# Patient Record
Sex: Male | Born: 1960 | Race: White | Hispanic: No | Marital: Married
Health system: Southern US, Academic
[De-identification: ages and names within clinical notes are randomized; demographics above are authoritative.]

## PROBLEM LIST (undated history)

## (undated) DIAGNOSIS — Z720 Tobacco use: Secondary | ICD-10-CM

## (undated) DIAGNOSIS — G8929 Other chronic pain: Secondary | ICD-10-CM

## (undated) DIAGNOSIS — M549 Dorsalgia, unspecified: Secondary | ICD-10-CM

## (undated) HISTORY — PX: HX BACK SURGERY: SHX140

---

## 2003-05-22 ENCOUNTER — Other Ambulatory Visit (HOSPITAL_COMMUNITY): Payer: Self-pay

## 2006-04-15 ENCOUNTER — Ambulatory Visit (INDEPENDENT_AMBULATORY_CARE_PROVIDER_SITE_OTHER): Payer: Self-pay | Admitting: Sports Medicine

## 2010-10-05 DEATH — deceased

## 2021-03-22 ENCOUNTER — Encounter (HOSPITAL_COMMUNITY): Payer: Self-pay

## 2021-03-22 ENCOUNTER — Inpatient Hospital Stay (HOSPITAL_COMMUNITY): Payer: 59

## 2021-03-22 ENCOUNTER — Inpatient Hospital Stay
Admission: AD | Admit: 2021-03-22 | Discharge: 2021-04-02 | DRG: 871 | Discharge status: Against Medical Advice | Payer: 59 | Source: Other Acute Inpatient Hospital | Attending: Internal Medicine | Admitting: Internal Medicine

## 2021-03-22 ENCOUNTER — Inpatient Hospital Stay (HOSPITAL_COMMUNITY): Payer: PRIVATE HEALTH INSURANCE | Admitting: Internal Medicine

## 2021-03-22 ENCOUNTER — Other Ambulatory Visit: Payer: Self-pay

## 2021-03-22 DIAGNOSIS — Z5329 Procedure and treatment not carried out because of patient's decision for other reasons: Secondary | ICD-10-CM | POA: Diagnosis not present

## 2021-03-22 DIAGNOSIS — Z79899 Other long term (current) drug therapy: Secondary | ICD-10-CM

## 2021-03-22 DIAGNOSIS — B1920 Unspecified viral hepatitis C without hepatic coma: Secondary | ICD-10-CM | POA: Diagnosis present

## 2021-03-22 DIAGNOSIS — R918 Other nonspecific abnormal finding of lung field: Secondary | ICD-10-CM

## 2021-03-22 DIAGNOSIS — Z20828 Contact with and (suspected) exposure to other viral communicable diseases: Secondary | ICD-10-CM | POA: Diagnosis present

## 2021-03-22 DIAGNOSIS — Z8701 Personal history of pneumonia (recurrent): Secondary | ICD-10-CM

## 2021-03-22 DIAGNOSIS — R590 Localized enlarged lymph nodes: Secondary | ICD-10-CM

## 2021-03-22 DIAGNOSIS — J90 Pleural effusion, not elsewhere classified: Secondary | ICD-10-CM | POA: Diagnosis present

## 2021-03-22 DIAGNOSIS — K219 Gastro-esophageal reflux disease without esophagitis: Secondary | ICD-10-CM | POA: Diagnosis present

## 2021-03-22 DIAGNOSIS — J189 Pneumonia, unspecified organism: Principal | ICD-10-CM

## 2021-03-22 DIAGNOSIS — F17200 Nicotine dependence, unspecified, uncomplicated: Secondary | ICD-10-CM | POA: Diagnosis present

## 2021-03-22 DIAGNOSIS — E871 Hypo-osmolality and hyponatremia: Secondary | ICD-10-CM | POA: Diagnosis not present

## 2021-03-22 DIAGNOSIS — J851 Abscess of lung with pneumonia: Secondary | ICD-10-CM | POA: Diagnosis present

## 2021-03-22 DIAGNOSIS — J440 Chronic obstructive pulmonary disease with acute lower respiratory infection: Secondary | ICD-10-CM | POA: Diagnosis present

## 2021-03-22 DIAGNOSIS — M549 Dorsalgia, unspecified: Secondary | ICD-10-CM | POA: Diagnosis present

## 2021-03-22 DIAGNOSIS — J9601 Acute respiratory failure with hypoxia: Secondary | ICD-10-CM | POA: Diagnosis present

## 2021-03-22 DIAGNOSIS — A419 Sepsis, unspecified organism: Principal | ICD-10-CM | POA: Diagnosis present

## 2021-03-22 DIAGNOSIS — F4310 Post-traumatic stress disorder, unspecified: Secondary | ICD-10-CM | POA: Diagnosis present

## 2021-03-22 DIAGNOSIS — M4800 Spinal stenosis, site unspecified: Secondary | ICD-10-CM | POA: Diagnosis present

## 2021-03-22 HISTORY — DX: Dorsalgia, unspecified: M54.9

## 2021-03-22 HISTORY — DX: Tobacco use: Z72.0

## 2021-03-22 HISTORY — DX: Other chronic pain: G89.29

## 2021-03-22 LAB — BASIC METABOLIC PANEL
ANION GAP: 8 mmol/L (ref 4–13)
BUN/CREA RATIO: 34 — ABNORMAL HIGH (ref 6–22)
BUN: 23 mg/dL (ref 8–25)
CALCIUM: 9.8 mg/dL (ref 8.8–10.2)
CHLORIDE: 101 mmol/L (ref 96–111)
CO2 TOTAL: 25 mmol/L (ref 23–31)
CREATININE: 0.67 mg/dL — ABNORMAL LOW (ref 0.75–1.35)
ESTIMATED GFR: >90 mL/min/BSA (ref >=60)
GLUCOSE: 123 mg/dL (ref 65–125)
POTASSIUM: 4.6 mmol/L (ref 3.5–5.1)
SODIUM: 134 mmol/L — ABNORMAL LOW (ref 136–145)

## 2021-03-22 LAB — CBC WITH DIFF
BASOPHIL #: 0.1 10*3/uL (ref <=0.20)
BASOPHIL %: 0 %
EOSINOPHIL #: <0.1 10*3/uL (ref <=0.50)
EOSINOPHIL %: 0 %
HCT: 34.5 % — ABNORMAL LOW (ref 38.9–52.0)
HGB: 11.2 g/dL — ABNORMAL LOW (ref 13.4–17.5)
IMMATURE GRANULOCYTE #: 0.92 10*3/uL — ABNORMAL HIGH (ref <0.10)
IMMATURE GRANULOCYTE %: 3 % — ABNORMAL HIGH (ref 0–1)
LYMPHOCYTE #: 0.67 10*3/uL — ABNORMAL LOW (ref 1.00–4.80)
LYMPHOCYTE %: 2 %
MCH: 26.5 pg (ref 26.0–32.0)
MCHC: 32.5 g/dL (ref 31.0–35.5)
MCV: 81.6 fL (ref 78.0–100.0)
MONOCYTE #: 1.37 10*3/uL — ABNORMAL HIGH (ref 0.20–1.10)
MONOCYTE %: 5 %
MPV: 9.5 fL (ref 8.7–12.5)
NEUTROPHIL #: 26.78 10*3/uL — ABNORMAL HIGH (ref 1.50–7.70)
NEUTROPHIL %: 90 %
PLATELETS: 334 10*3/uL (ref 150–400)
RBC: 4.23 10*6/uL — ABNORMAL LOW (ref 4.50–6.10)
RDW-CV: 15.2 % (ref 11.5–15.5)
WBC: 29.9 10*3/uL — ABNORMAL HIGH (ref 3.7–11.0)

## 2021-03-22 LAB — MAGNESIUM: MAGNESIUM: 1.9 mg/dL (ref 1.8–2.6)

## 2021-03-22 LAB — MRSA SCREEN, PCR, RAPID: MRSA COLONIZATION SCREEN: NEGATIVE

## 2021-03-22 LAB — RESPIRATORY CULTURE AND GRAM STAIN (PERFORMABLE)

## 2021-03-22 LAB — PHOSPHORUS: PHOSPHORUS: 4.1 mg/dL — ABNORMAL HIGH (ref 2.3–4.0)

## 2021-03-22 MED ORDER — ENOXAPARIN 40 MG/0.4 ML SUBCUTANEOUS SYRINGE
40.0000 mg | INJECTION | Freq: Every day | SUBCUTANEOUS | Status: DC
Start: 2021-03-23 — End: 2021-04-02
  Administered 2021-03-23 – 2021-03-26 (×4): 40 mg via SUBCUTANEOUS
  Administered 2021-03-27: 0 mg via SUBCUTANEOUS
  Administered 2021-03-28 – 2021-04-02 (×6): 40 mg via SUBCUTANEOUS
  Filled 2021-03-22 (×11): qty 0.4

## 2021-03-22 MED ORDER — METHOCARBAMOL 750 MG TABLET
750.0000 mg | ORAL_TABLET | Freq: Two times a day (BID) | ORAL | Status: DC
Start: 2021-03-23 — End: 2021-03-25
  Administered 2021-03-23 – 2021-03-25 (×5): 750 mg via ORAL
  Filled 2021-03-22 (×5): qty 1

## 2021-03-22 MED ORDER — DEXTROSE 5% IN WATER (D5W) FLUSH BAG - 250 ML
INTRAVENOUS | Status: DC | PRN
Start: 2021-03-22 — End: 2021-04-02

## 2021-03-22 MED ORDER — ELECTROLYTE-A INTRAVENOUS SOLUTION BOLUS
0.0000 mL | INTRAVENOUS | Status: DC | PRN
Start: 2021-03-22 — End: 2021-03-22

## 2021-03-22 MED ORDER — PIPERACILLIN-TAZOBACTAM 4.5 GRAM INTRAVENOUS SOLUTION
4.5000 g | Freq: Three times a day (TID) | INTRAVENOUS | Status: DC
Start: 2021-03-23 — End: 2021-04-02
  Administered 2021-03-22 – 2021-03-23 (×2): 4.5 g via INTRAVENOUS
  Administered 2021-03-23: 0 g via INTRAVENOUS
  Administered 2021-03-23: 4.5 g via INTRAVENOUS
  Administered 2021-03-23 (×2): 0 g via INTRAVENOUS
  Administered 2021-03-24 (×3): 4.5 g via INTRAVENOUS
  Administered 2021-03-24 (×2): 0 g via INTRAVENOUS
  Administered 2021-03-24: 4.5 g via INTRAVENOUS
  Administered 2021-03-24: 0 g via INTRAVENOUS
  Administered 2021-03-25: 4.5 g via INTRAVENOUS
  Administered 2021-03-25: 0 g via INTRAVENOUS
  Administered 2021-03-25: 4.5 g via INTRAVENOUS
  Administered 2021-03-25 – 2021-03-26 (×3): 0 g via INTRAVENOUS
  Administered 2021-03-26: 4.5 g via INTRAVENOUS
  Administered 2021-03-26: 0 g via INTRAVENOUS
  Administered 2021-03-26 (×2): 4.5 g via INTRAVENOUS
  Administered 2021-03-26 – 2021-03-27 (×2): 0 g via INTRAVENOUS
  Administered 2021-03-27: 4.5 g via INTRAVENOUS
  Administered 2021-03-27: 0 g via INTRAVENOUS
  Administered 2021-03-27: 4.5 g via INTRAVENOUS
  Administered 2021-03-27: 0 g via INTRAVENOUS
  Administered 2021-03-27: 4.5 g via INTRAVENOUS
  Administered 2021-03-28 (×2): 0 g via INTRAVENOUS
  Administered 2021-03-28: 4.5 g via INTRAVENOUS
  Administered 2021-03-28: 0 g via INTRAVENOUS
  Administered 2021-03-28 – 2021-03-29 (×3): 4.5 g via INTRAVENOUS
  Administered 2021-03-29 (×3): 0 g via INTRAVENOUS
  Administered 2021-03-29 (×2): 4.5 g via INTRAVENOUS
  Administered 2021-03-30: 0 g via INTRAVENOUS
  Administered 2021-03-30: 4.5 g via INTRAVENOUS
  Administered 2021-03-30: 0 g via INTRAVENOUS
  Administered 2021-03-30: 4.5 g via INTRAVENOUS
  Administered 2021-03-30: 0 g via INTRAVENOUS
  Administered 2021-03-30 – 2021-03-31 (×2): 4.5 g via INTRAVENOUS
  Administered 2021-03-31 (×3): 0 g via INTRAVENOUS
  Administered 2021-03-31 – 2021-04-01 (×5): 4.5 g via INTRAVENOUS
  Administered 2021-04-01 – 2021-04-02 (×5): 0 g via INTRAVENOUS
  Administered 2021-04-02 (×2): 4.5 g via INTRAVENOUS
  Filled 2021-03-22 (×33): qty 20

## 2021-03-22 MED ORDER — NICOTINE 21 MG/24 HR DAILY TRANSDERMAL PATCH
21.0000 mg | MEDICATED_PATCH | Freq: Every day | TRANSDERMAL | Status: DC
Start: 2021-03-22 — End: 2021-04-02
  Administered 2021-03-22 – 2021-04-02 (×12): 21 mg via TRANSDERMAL
  Filled 2021-03-22 (×12): qty 1

## 2021-03-22 MED ORDER — SODIUM CHLORIDE 0.9 % INTRAVENOUS PIGGYBACK
4.5000 g | INJECTION | INTRAVENOUS | Status: AC
Start: 2021-03-22 — End: 2021-03-22
  Administered 2021-03-22: 4.5 g via INTRAVENOUS
  Administered 2021-03-22: 0 g via INTRAVENOUS
  Filled 2021-03-22: qty 20

## 2021-03-22 MED ORDER — VANCOMYCIN 10 GRAM INTRAVENOUS SOLUTION
15.0000 mg/kg | Freq: Two times a day (BID) | INTRAVENOUS | Status: DC
Start: 2021-03-22 — End: 2021-03-23
  Administered 2021-03-22: 0 mg via INTRAVENOUS
  Administered 2021-03-22 – 2021-03-23 (×2): 1250 mg via INTRAVENOUS
  Administered 2021-03-23 (×2): 0 mg via INTRAVENOUS
  Filled 2021-03-22 (×2): qty 12.5

## 2021-03-22 MED ORDER — GABAPENTIN 300 MG CAPSULE
300.0000 mg | ORAL_CAPSULE | Freq: Three times a day (TID) | ORAL | Status: DC
Start: 2021-03-22 — End: 2021-03-23
  Administered 2021-03-22 – 2021-03-23 (×2): 300 mg via ORAL
  Filled 2021-03-22 (×2): qty 1

## 2021-03-22 MED ORDER — SODIUM CHLORIDE 0.9 % (FLUSH) INJECTION SYRINGE
2.0000 mL | INJECTION | Freq: Three times a day (TID) | INTRAMUSCULAR | Status: DC
Start: 2021-03-22 — End: 2021-04-02
  Administered 2021-03-22 (×2): 6 mL
  Administered 2021-03-23 (×2): 0 mL
  Administered 2021-03-23 – 2021-03-24 (×2): 4 mL
  Administered 2021-03-24: 6 mL
  Administered 2021-03-24: 0 mL
  Administered 2021-03-25: 5 mL
  Administered 2021-03-25 (×2): 0 mL
  Administered 2021-03-26: 2 mL
  Administered 2021-03-26 – 2021-03-27 (×4): 0 mL
  Administered 2021-03-27: 2 mL
  Administered 2021-03-28: 6 mL
  Administered 2021-03-28 – 2021-03-29 (×5): 0 mL
  Administered 2021-03-30: 6 mL
  Administered 2021-03-30 – 2021-04-02 (×9): 0 mL

## 2021-03-22 MED ORDER — SODIUM CHLORIDE 0.9 % (FLUSH) INJECTION SYRINGE
2.0000 mL | INJECTION | INTRAMUSCULAR | Status: DC | PRN
Start: 2021-03-22 — End: 2021-04-02

## 2021-03-22 MED ORDER — SODIUM CHLORIDE 0.9% FLUSH BAG - 250 ML
INTRAVENOUS | Status: DC | PRN
Start: 2021-03-22 — End: 2021-04-02

## 2021-03-22 MED ORDER — VANCOMYCIN IV - PHARMACIST TO DOSE PER PROTOCOL - NO EXCLUSION CRITERIA
Freq: Every day | Status: DC | PRN
Start: 2021-03-22 — End: 2021-03-23

## 2021-03-22 NOTE — H&P (Signed)
Medical Intensive Care Unit  HISTORY & PHYSICAL     Name: Alexander, Richard, 61 y.o. male Date of Service:  03/22/2021   MRN  Date of Birth:  May 25, 1960  [id number] Attending: Eula Flax, MD   PCP: Morton Amy, MD Chief Complaint: had no chief complaint listed for this encounter.   Code Status: No Order Admitted for: Pneumonia     HISTORY OF PRESENTING ILLNESS:     Alexander Richard is a 61 y.o. male who presented to MICU 1 service on 03/22/21 from [place] with concerns of hemoptysis, fevers, and chills for the last 4 days. Pt notes covid exposure 1 week ago. Pt has had a right lung mass-like consolidation since 10/2020. He has undergone BAL that was negative for AFB/legionella at the [place].   On CT w/o contrast at the [place] the pt was found to have worsening right middle lobe. There is a new cavitary mass-like consolidation in the central  Superior right lower lobe. Concerning for cavitary pneumonia, intrapulmonary abscess, or neoplasm.      MEDICAL HISTORY:     PAST MEDICAL & SURGICAL HISTORIES:   No past medical history on file. No past surgical history on file.   HOME MEDICATIONS:  No outpatient medications have been marked as taking for the 03/22/21 encounter [facility] Encounter).      ALLERGIES:  He has No Known Allergies.      FAMILY HISTORY:  His family history is not on file.   SOCIAL HISTORY:  He  has no history on file for alcohol use. He  has no history on file for tobacco use. He  has no history on file for drug use.     ROS:            Constitutional: + for fevers, chills, diaphoresis  Respiratory: + for hemoptysis, shortness of breath  Cardiovascular: Negative for chest pain, palpitations, lower extremity edema  Gastrointestinal: +for nausea, and decreased appetite       OBJECTIVE:    VITAL SIGNS:   BP (Non-Invasive): 111/72 Heart Rate: 84   Respiratory Rate: (!) 38 SpO2: 90 % Weight: 96.5 kg (212 lb 11.9 oz)        INTAKE & OUTPUT:  Net Since Admission: --    Intake/Output Summary (Last 24  hours) at 03/22/2021 1911  Last data filed at 03/22/2021 1820  Gross per 24 hour   Intake 100 ml   Output --   Net 100 ml     Date of Last Bowel Movement:  (PTA)         VENTILATOR SETTINGS:  Ventilator Settings:   room air          LINES & DRAINS:   Patient Lines/Drains/Airways Status     Active Line / Dialysis Catheter / Dialysis Graft / Drain / Airway / Wound     Name Placement date Placement time Site Days    Peripheral IV Ultrasound guided Left Forearm 03/22/21  1700  -- less than 1    Midline Single Lumen Left 03/22/21  1639  --  less than 1                 Physical Exam  General: appears older than stated age, NAEO  Eyes: Conjunctiva clear, Pupils equal and round, Sclera non-icteric  HENT: Head atraumatic and normocephalic, Mouth mucous membranes pink  Neck: Trachea Midline   Lungs: Clear to auscultation bilaterally, mildly diminished  Cardiovascular: Regular rate and rhythm, no murmur, click,  rub or gallop  Abdomen: Soft, non-tender, Bowel sounds normal, non-distended  Extremities: No cyanosis or edema  Skin: Skin warm and dry  Neurologic: Alert and oriented x3    LABS  IMAGING  MICROBIOLOGY  PATHOLOGY:     I have reviewed all of the recent labs, imaging, microbiology and pathology.    CBC Differential   Recent Labs     03/22/21  1655   WBC 29.9*   HGB 11.2*   HCT 34.5*   PLTCNT 334    Recent Labs     03/22/21  1655   PMNS 90   MONOCYTES 5   BASOPHILS 0  0.10   PMNABS 26.78*   LYMPHSABS 0.67*   MONOSABS 1.37*   EOSABS <0.10      BMP LFTs   Recent Labs     03/22/21  1655   SODIUM 134*   POTASSIUM 4.6   CHLORIDE 101   CO2 25   BUN 23   CREATININE 0.67*   GLUCOSENF 123   ANIONGAP 8   BUNCRRATIO 34*   GFR >90   CALCIUM 9.8   MAGNESIUM 1.9   PHOSPHORUS 4.1*    No results found for this encounter   CoAgs Blood Gas:   No results found for this encounter No results found for this encounter    Cardiac Markers Lipid Panel   No results for input(s): TROPONINI, CKMB, MBINDEX, BNP in the last 72 hours. No results found  for this encounter   Urine Analysis Other Labs   No results found for this encounter No results found for this encounter    Invalid input(s): PRL     Blood Cx 03/22/21 - pending    CT Chest 03/22/21 (Jay): worsening right middle lobe consolidation. New cavitary, mass-like consolidation in the central superior right lower lobe. Small loculated right sided pleural effusion. Enlarging right hilar and mediastinal lymphadenopathy    ASSESSMENT:     Alexander Richard is a 61 y.o. male with a past medical hx of spinal stenosis, chronic back pain, tobacco use, and GERd , who presented to MICU as a transfer from [facility] with new mass-like consolidation in right lower lung and recurrent pneumonias. Pt was hypotensive at the outside hospital but this has resolved.      Active Hospital Problems    Diagnosis   . Primary Problem: Pneumonia       PLAN:     Neurologic:  No Acute concerns  . Alert and Oriented x3     Cardiovascular:  Hypotension, resolved  . Continue to monitor    Respiratory:  Concern for lung mass vs cavitary pna vs intrapulmonary abscess  . On Room Air  Imaging significant for worsening right middle lobe consolidation. New cavitary, mass-like consolidation in the central superior right lower lobe. Small loculated right sided pleural effusion. Enlarging right hilar and mediastinal lymphadenopathy  . MRSA swab pending, Respiratory Biofire, Streptococcal and Legionella Urine Antigens pending  . Vanc zosyn, first doses given at [place]. Next dose 3/20 in the morning  . Sputum cultures  . Incentive Spirometry        GI/FEN:  No acute concerns   Possible procedure in am, NPO at midnight    Renal:  No acute concerns  . Creatinine 0.67  . Strict I/O    Infectious Diseases:  Recurrent Pneumonia  . WBC 29.9  . Blood cultures pending  . Sputum cultures pending  . Continue Vanc/zosyn for broad spectrum coverage  Endocrinologic:  No acute concerns       Hematology  No acute concerns      ___     Diet: Regular diet,  NPO at midnight   Fluids: None   Analgesia: Methocarbamol, gabapentin   Sedation: None   DVT/PE Prophylaxis: lovenox   Ulcer Prophylaxis: none   Glycemic Control: none   Bowel Regimen: none    Consults: none   Hardware (Lines, Drains, Foley, Tubes): PIV    Activity: Up w/ assistance and Up in chair TID w/ all meals   Therapy: PT/OT       Disposition Planning - TBD    // Rosemary Holms, DO   Resident, PGY-1  Department of Anesthesiology  03/22/2021, 19:11        ----------------------------------------------------------------------------------------------------------------------  Attending Note:   DOS: 03/22/2021  I have seen and evaluated the patient with the medical staff, all labs, and imaging studies reviewed.   I agree with the findings including assessment and plan as documented in the resident's note.  Any exceptions/additions are noted .    Case was discussed in the multidiscipliary rounds.     Total Critical care Time Spent (not including procedures): 30 min     Hatim Al-Jaroushi MD  Associate Professor, [place]

## 2021-03-22 NOTE — Pharmacy (Signed)
[facility] / Department of Pharmaceutical Services  Therapeutic Drug Monitoring: Vancomycin  03/22/2021      Patient name: Alexander Richard  Date of Birth:  16-Nov-1960    Actual Weight:  Weight: 96.5 kg (212 lb 11.9 oz) (03/22/21 1700)     BMI:  BMI (Calculated): 29.73 (03/22/21 1700)      Date RPh Current regimen (including mg/kg) Indication &  Organism AUC or trough based dosing Target Levels^ SCr (mg/dL) CrCl* (mL/min) Infectious Laboratory Markers (as applicable)   Measured level(s)   (mcg/mL) Calculated AUC (if AUC based monitoring) Plan & predicted AUC/trough if initial dosing (including when levels are due) Comments   3/19 eef Vanc 1000 mg x 1 @ 0500 at OSH empiric AUC 400-600 0.67 >100 WBC:  Procal:  CRP:   - Vanc 1250 mg q12h for anticipated AUC of 437  - f/u cultures and de-escalate as able  - obtain levels as clinically indicated                                                                                                    ^Target levels depends on dosing and monitoring method, AUC vs. trough based. For AUC based dosing units are mg*h/L. For trough based dosing units are mcg/mL.     *Creatinine clearance is estimated by using the Cockcroft-Gault equation for adult patients and the Brendolyn Patty for pediatric patients.    The decision to discontinue vancomycin therapy will be determined by the primary service.  Please contact the pharmacist with any questions regarding this patient's medication regimen.

## 2021-03-22 NOTE — Nurses Notes (Signed)
Pt transferred from outside facility to [id number]. MICU to bedside. VS and assessment per flowsheets.

## 2021-03-23 ENCOUNTER — Inpatient Hospital Stay (HOSPITAL_COMMUNITY): Payer: 59

## 2021-03-23 DIAGNOSIS — F4310 Post-traumatic stress disorder, unspecified: Secondary | ICD-10-CM

## 2021-03-23 DIAGNOSIS — J449 Chronic obstructive pulmonary disease, unspecified: Secondary | ICD-10-CM

## 2021-03-23 DIAGNOSIS — J439 Emphysema, unspecified: Secondary | ICD-10-CM

## 2021-03-23 DIAGNOSIS — R918 Other nonspecific abnormal finding of lung field: Secondary | ICD-10-CM

## 2021-03-23 DIAGNOSIS — I2510 Atherosclerotic heart disease of native coronary artery without angina pectoris: Secondary | ICD-10-CM

## 2021-03-23 DIAGNOSIS — R590 Localized enlarged lymph nodes: Secondary | ICD-10-CM

## 2021-03-23 DIAGNOSIS — M542 Cervicalgia: Secondary | ICD-10-CM

## 2021-03-23 DIAGNOSIS — J984 Other disorders of lung: Secondary | ICD-10-CM

## 2021-03-23 DIAGNOSIS — Z79899 Other long term (current) drug therapy: Secondary | ICD-10-CM

## 2021-03-23 LAB — MAGNESIUM: MAGNESIUM: 1.9 mg/dL (ref 1.8–2.6)

## 2021-03-23 LAB — BASIC METABOLIC PANEL, FASTING
ANION GAP: 7 mmol/L (ref 4–13)
BUN/CREA RATIO: 40 — ABNORMAL HIGH (ref 6–22)
BUN: 28 mg/dL — ABNORMAL HIGH (ref 8–25)
CALCIUM: 9.1 mg/dL (ref 8.8–10.2)
CHLORIDE: 101 mmol/L (ref 96–111)
CO2 TOTAL: 26 mmol/L (ref 23–31)
CREATININE: 0.7 mg/dL — ABNORMAL LOW (ref 0.75–1.35)
ESTIMATED GFR: >90 mL/min/BSA (ref >=60)
GLUCOSE: 96 mg/dL (ref 70–99)
POTASSIUM: 4.4 mmol/L (ref 3.5–5.1)
SODIUM: 134 mmol/L — ABNORMAL LOW (ref 136–145)

## 2021-03-23 LAB — LEGIONELLA URINE ANTIGEN: LEGIONELLA ANTIGEN: NEGATIVE

## 2021-03-23 LAB — RESPIRATORY VIRUS PANEL

## 2021-03-23 LAB — CBC WITH DIFF
BASOPHIL #: <0.1 10*3/uL (ref <=0.20)
BASOPHIL %: 0 %
EOSINOPHIL #: <0.1 10*3/uL (ref <=0.50)
EOSINOPHIL %: 0 %
HCT: 37.5 % — ABNORMAL LOW (ref 38.9–52.0)
HGB: 12.2 g/dL — ABNORMAL LOW (ref 13.4–17.5)
IMMATURE GRANULOCYTE #: 0.74 10*3/uL — ABNORMAL HIGH (ref <0.10)
IMMATURE GRANULOCYTE %: 3 % — ABNORMAL HIGH (ref 0–1)
LYMPHOCYTE #: 0.67 10*3/uL — ABNORMAL LOW (ref 1.00–4.80)
LYMPHOCYTE %: 3 %
MCH: 26.4 pg (ref 26.0–32.0)
MCHC: 32.5 g/dL (ref 31.0–35.5)
MCV: 81.2 fL (ref 78.0–100.0)
MONOCYTE #: 1.42 10*3/uL — ABNORMAL HIGH (ref 0.20–1.10)
MONOCYTE %: 6 %
MPV: 9.3 fL (ref 8.7–12.5)
NEUTROPHIL #: 22.57 10*3/uL — ABNORMAL HIGH (ref 1.50–7.70)
NEUTROPHIL %: 88 %
PLATELETS: 275 10*3/uL (ref 150–400)
RBC: 4.62 10*6/uL (ref 4.50–6.10)
RDW-CV: 15.4 % (ref 11.5–15.5)
WBC: 25.5 10*3/uL — ABNORMAL HIGH (ref 3.7–11.0)

## 2021-03-23 LAB — PHOSPHORUS: PHOSPHORUS: 4.3 mg/dL — ABNORMAL HIGH (ref 2.3–4.0)

## 2021-03-23 LAB — STREPTOCOCCUS PNEUMONIAE ANTIGEN,URINE: S.PNEUMONIA ANTIGEN: NEGATIVE

## 2021-03-23 MED ORDER — IOPAMIDOL 370 MG IODINE/ML (76 %) INTRAVENOUS SOLUTION
80.0000 mL | INTRAVENOUS | Status: AC
Start: 2021-03-23 — End: 2021-03-23
  Administered 2021-03-23: 80 mL via INTRAVENOUS

## 2021-03-23 MED ORDER — ALBUTEROL SULFATE CONCENTRATE 2.5 MG/0.5 ML SOLUTION FOR NEBULIZATION
2.5000 mg | INHALATION_SOLUTION | RESPIRATORY_TRACT | Status: DC | PRN
Start: 2021-03-23 — End: 2021-04-02
  Filled 2021-03-23: qty 3

## 2021-03-23 MED ORDER — GABAPENTIN 300 MG CAPSULE
600.0000 mg | ORAL_CAPSULE | Freq: Three times a day (TID) | ORAL | Status: DC
Start: 2021-03-23 — End: 2021-04-02
  Administered 2021-03-23 – 2021-03-27 (×13): 600 mg via ORAL
  Administered 2021-03-27: 0 mg via ORAL
  Administered 2021-03-28 – 2021-04-02 (×17): 600 mg via ORAL
  Filled 2021-03-23 (×30): qty 2

## 2021-03-23 MED ORDER — IPRATROPIUM 0.5 MG-ALBUTEROL 3 MG (2.5 MG BASE)/3 ML NEBULIZATION SOLN
3.0000 mL | INHALATION_SOLUTION | Freq: Four times a day (QID) | RESPIRATORY_TRACT | Status: DC
Start: 2021-03-23 — End: 2021-03-23
  Filled 2021-03-23: qty 3

## 2021-03-23 MED ORDER — DULOXETINE 60 MG CAPSULE,DELAYED RELEASE
60.0000 mg | DELAYED_RELEASE_CAPSULE | Freq: Every day | ORAL | Status: DC
Start: 2021-03-23 — End: 2021-04-02
  Administered 2021-03-23 – 2021-04-02 (×11): 60 mg via ORAL
  Filled 2021-03-23 (×11): qty 1

## 2021-03-23 MED ORDER — SENNOSIDES 8.6 MG-DOCUSATE SODIUM 50 MG TABLET
1.0000 | ORAL_TABLET | Freq: Two times a day (BID) | ORAL | Status: DC
Start: 2021-03-23 — End: 2021-04-02
  Administered 2021-03-23 – 2021-03-24 (×4): 0 via ORAL
  Administered 2021-03-25 – 2021-03-26 (×3): 1 via ORAL
  Administered 2021-03-26: 0 via ORAL
  Administered 2021-03-27: 1 via ORAL
  Administered 2021-03-27: 0 via ORAL
  Administered 2021-03-28: 1 via ORAL
  Administered 2021-03-28 – 2021-04-02 (×7): 0 via ORAL
  Filled 2021-03-23 (×10): qty 1

## 2021-03-23 NOTE — Pharmacy (Addendum)
Pharmacy Medication Reconciliation    Patient Name: Alexander Richard, Alexander Richard  Date of Service: 03/23/2021  Date of Admission: 03/22/2021  Date of Birth: 1960-12-15  Length of Stay:   1 day   Service: MICU1      Transitions of Care:  Discharge Pharmacy services were discussed with the patient and the Meds to [facility] flowsheet and preferred pharmacy information were updated in EMR if applicable and able to assess with patient.    Information was collected from:  Patient and Pharmacy    [facility] [place], [place] - [address]  [address] [place] [address]  Phone: [phone] Fax: [phone]    [facility]  [address]  [place] [address]  Phone: [phone] Fax: [phone]    Clarified Prior to Admission Medications:  Prior to Admission medications    Medication Sig Taking Resumed Y/N (RPh) Comments   albuterol sulfate (PROVENTIL OR VENTOLIN OR PROAIR) 90 mcg/actuation Inhalation oral inhaler Take 2 Puffs by inhalation Four times a day as needed Yes  N Last fill 09/19 for 90 days              cetirizine (ZYRTEC) 10 mg Oral Tablet Take 1 Tablet (10 mg total) by mouth Once a day Yes  N Last fill 09/02 for 90 days        Cholecalciferol, Vitamin D3, 50 mcg (2,000 unit) Oral Capsule Take 1 Capsule by mouth Once a day Yes  N Last fill November 2022 for 90 days        diclofenac sodium (VOLTAREN) 1 % Gel Apply 4 g topically Three times a day as needed Yes  N Last fill 02/08 for 90 days        DULoxetine (CYMBALTA DR) 60 mg Oral Capsule, Delayed Release(E.C.) Take 1 Capsule (60 mg total) by mouth Once a day Yes  N Last fill 02/20 for 90 days    PT has been without for about 38 hours now and states he experiences adverse symptoms if not taken every 24 hours      gabapentin (NEURONTIN) 600 mg Oral Tablet Take 1 Tablet (600 mg total) by mouth Three times a day Yes  Y - started at 300 mg TID Last fill 03/13, unable to verify on PDMP - reported to me by [facility] pharmacist      meloxicam (MOBIC) 15 mg Oral Tablet Take 1 Tablet (15 mg total) by mouth Once a day Yes  N Last fill September 2022 for 90 days      methocarbamoL (ROBAXIN) 750 mg Oral Tablet Take 1 Tablet (750 mg total) by mouth Twice daily Yes  Y Last fill February 2023 for 90 days        tamsulosin (FLOMAX) 0.4 mg Oral Capsule Take 1 Capsule (0.4 mg total) by mouth Every evening after dinner Yes  N Last fill December 2022 for 90 days        tiotropium-olodateroL (STIOLTO RESPIMAT) 2.5-2.5 mcg/actuation Inhalation Mist Take 2 INHALATION by inhalation Once a day Yes  N Last fill January 2023 for 90 days              Did patient's home medication list require updates? Yes    Medications UPDATED on Prior to Admission Med List:    Medications ADDED to Prior to Admission Med List:  All medications added new; reported by [facility] and confirmed by patient.    Allergies:    No Known  Allergies    Meribeth Mattes, Pharmacy Technician    Did pharmacist make suggestions for medication reconciliation? Yes    Medications REMOVED from home medication list:  No medications removed at this time.  Pharmacist Recommendations:  Patient requested resuming home Cymbalta and gabapentin. Gabapentin 300 mg TID started inpatient - discussed with primary and will increase to home regimen. Will discuss resumption of Cymbalta. No other medications clinically indicated at this time - will continue to assess.    Patient reports adherence to gabapentin, methocarbamol, inhalers, and duloxetine - noted nonadherent to remainder of medications listed. Removed Vitamin D3, Meloxicam, and Cetirizine and diclofenac medications. Updated gabapentin order and added duloxetine.     Lorenza Cambridge, PHARMACY RESIDENT  03/23/2021, 10:29

## 2021-03-23 NOTE — Care Plan (Signed)
[facility]  Rehabilitation Services  Physical Therapy Initial Evaluation    Patient Name: Alexander Richard  Date of Birth: 06/16/1960  Height: Height: 180.3 cm (5\' 11" )  Weight: Weight: 96.5 kg (212 lb 11.9 oz)  Room/Bed: 15/A  Payor: [employer] / Plan: [facility] [employer]AN / Product Type: Non Managed Care /     Assessment:   Alexander Richard is a 61 y.o. male with a past medical hx of spinal stenosis, chronic back pain, tobacco use, and GERd , who presented to MICU as a transfer from [place] with new mass-like consolidation in right lower lung and recurrent pneumonias.     pt seen in MICU. pt alert and cooperative. pt moving all limbs well. pt amb x 50 ft x CGA x fair- balance. Pt recommends home with assist when medically stable. At baseline pt indept comm amb and drives, no DME, lives with wife.    Discharge Needs:    Equipment Recommendation: (P) none anticipated       Discharge Disposition: (P) home with assist    JUSTIFICATION OF DISCHARGE RECOMMENDATION   Based on current diagnosis, functional performance prior to admission, and current functional performance, this patient requires continued PT services in (P) home with assist in order to achieve significant functional improvements in these deficit areas: (P) aerobic capacity/endurance, gait, locomotion, and balance.        Plan:   Current Intervention: (P) balance training, gait training, stair training, strengthening, transfer training  To provide physical therapy services (P) minimum of 2x/week  for duration of (P) until discharge.    The risks/benefits of therapy have been discussed with the patient/caregiver and he/she is in agreement with the established plan of care.       Subjective & Objective        03/23/21 0926   Therapist Pager   PT Assigned/ Pager # [phone]   Rehab Session   Document Type evaluation   Total PT Minutes: 23   Patient Effort good   Symptoms Noted During/After Treatment fatigue   Symptoms  Noted Comment dry cough   General Information   Patient Profile Reviewed yes   Onset of Illness/Injury or Date of Surgery 03/22/21   Pertinent History of Current Functional Problem Alexander Richard is a 61 y.o. male with a past medical hx of spinal stenosis, chronic back pain, tobacco use, and GERd , who presented to MICU as a transfer from [place] with new mass-like consolidation in right lower lung and recurrent pneumonias.   Medical Lines PIV Line;Telemetry   Respiratory Status nasal cannula   Existing Precautions/Restrictions fall precautions;full code   Mutuality/Individual Preferences   Anxieties, Fears or Concerns agreeable to Pt   Individualized Care Needs oob x sba   Plan of Care Reviewed With patient   Living Environment   Lives With spouse   Living Arrangements house   Home Assessment: No Problems Identified   Home Accessibility bed and bath on same level;stairs (1 railing present)   Living Environment Comment 8 STE with rail, lives one level , wife helps out   Functional Level Prior   Ambulation 0 - independent   Transferring 0 - independent   Toileting 0 - independent   Bathing 0 - independent   Dressing 0 - independent   Eating 0 - independent   Communication 0 - understands/communicates without difficulty   Prior Functional Level Comment indept comm am b, drives, wife helps out  Self-Care   Usual Activity Tolerance good   Current Activity Tolerance fair   Equipment Currently Used at Home no   Pre Treatment Status   Pre Treatment Patient Status Patient sitting in bedside chair or w/c;Call light within reach;Telephone within reach;Nurse approved session   Support Present Pre Treatment  None   Communication Pre Treatment  Nurse   Cognitive Assessment/Interventions   Behavior/Mood Observations behavior appropriate to situation, WNL/WFL   Orientation Status oriented x 4   Attention WNL/WFL   Follows Commands WNL   Vital Signs   Pre-Treatment Heart Rate (beats/min) 71   Post-treatment Heart Rate  (beats/min) 72   Pre-Treatment Resp Rate (breaths/min) 17   Pre Treatment BP 108/79   Pre SpO2 (%) 95   O2 Delivery Pre Treatment supplemental O2   Post SpO2 (%) 96   O2 Delivery Post Treatment supplemental O2   Vitals Comment 4L NC no s/s distress   Pain Assessment   Pre/Posttreatment Pain Comment no c/o pain   General Extremity Assessment   Comment arom x 4 x atleast 3/5 erect trunk in sit and stand   Transfer Assessment/Treatment   Sit-Stand Independence contact guard assist   Stand-Sit Independence contact guard assist   Sit-Stand-Sit, Assist Device handheld assist   Bed-Chair Independence contact guard assist   Bed-Chair-Bed Assist Device handheld assist   Gait Assessment/Treatment   Total Distance Ambulated 50   Independence  contact guard assist   Assistive Device  hand-held assistance   Distance in Feet 50   Gait Speed slow to medium   Safety Issues  balance decreased during turns   Impairments  endurance;strength decreased   Balance Skill Training   Comment xHH   Sitting Balance: Static good balance   Sitting, Dynamic (Balance) fair + balance   Sit-to-Stand Balance fair balance   Standing Balance: Static fair balance   Standing Balance: Dynamic fair - balance   Systems Impairment Contributing to Balance Disturbance musculoskeletal   Identified Impairments Contributing to Balance Disturbance decreased strength   Therapeutic Exercise/Activity   Comment arom x 4 x 20 reps, STS and amb traiining , upto chair   Functional Endurance Training   Comment, Functional Endurance fair-   Post Treatment Status   Post Treatment Patient Status Patient sitting in bedside chair or w/c;Call light within reach;Telephone within reach   Support Present Post Treatment  None   Communication Post Treatement Nurse   Communication Post Treatment Comment pt performance   Plan of Care Review   Plan Of Care Reviewed With patient   Basic Mobility Am-PAC/6Clicks Score (APPROVED PT Staff, [facility], [facility] ONLY, [facility], [facility], and [facility])    Turning in bed without bedrails 4   Lying on back to sitting on edge of flat bed 4   Moving to and from a bed to a chair 3   Standing up from chair 3   Walk in room 3   Climbing 3-5 steps with railing 3   6 Clicks Raw Score total 20   Standardized (t-scale) score 43.99   CMS 0-100% Score 33.32   CMS Modifier CJ   Patient Mobility Goal (JHHLM) 8- Walk 250 feet or more 3X/day   Exercise/Activity Level Performed 7- Walked 25 feet or more   Physical Therapy Clinical Impression   Assessment KACEY VICUNA is a 61 y.o. male with a past medical hx of spinal stenosis, chronic back pain, tobacco use, and GERd , who presented to MICU as a transfer from [place]  with new mass-like consolidation in right lower lung and recurrent pneumonias. pt seenin MICU. pt alert and cooperative. pt moving alllimbs well. pt amb x 50 ft x CGA x fair- balance. Pt reocmmends home with assist when medically stable. At baseline pt indept comm amb and drives, no dme.   Criteria for Skilled Therapeutic yes;meets criteria;skilled treatment is necessary   Pathology/Pathophysiology Noted musculoskeletal;pulmonary;endocrine/metabolic   Impairments Found (describe specific impairments) aerobic capacity/endurance;gait, locomotion, and balance   Functional Limitations in Following  self-care;home management   Disability: Inability to Perform community/leisure   Rehab Potential good   Therapy Frequency minimum of 2x/week   Predicted Duration of Therapy Intervention (days/wks) until discharge   Anticipated Equipment Needs at Discharge (PT) none anticipated   Anticipated Discharge Disposition home with assist   Evaluation Complexity Justification   Patient History: Co-morbidity/factors that impact Plan of Care 1-2 that impact Plan of Care   Examination Components 4 or more Exam elements addressed   Presentation Evolving: Symptoms, complaints, characteristics of condition changing &/or cognitive deficits present   Clinical Decision Making Moderate  complexity   Evaluation Complexity Moderate complexity   Care Plan Goals   PT Rehab Goals Gait Training Goal;Stairs Training Goal;Transfer Training Goal   Gait Training  Goal, Distance to Achieve   Gait Training  Goal, Date Established 03/23/21   Gait Training  Goal, Time to Achieve by discharge   Gait Training  Goal, Independence Level independent   Stairs Training Goal   Stairs Training Goal, Date Established 03/23/21   Stairs Training Goal, Time to Achieve by discharge   Stairs Training Goal, Independence Level independent   Stairs Training Goal, Number of Stairs to Achieve 8 steps x Armed forces technical officerrail   Transfer Training Goal   Transfer Training Goal, Date Established 03/23/21   Transfer Training Goal, Time to Achieve by discharge   Transfer Training Goal, Activity Type all transfers   Transfer Training Goal, Independence Level independent   Planned Therapy Interventions, PT Eval   Planned Therapy Interventions (PT) balance training;gait training;stair training;strengthening;transfer training   Psychosocial Support   Trust Relationship/Rapport care explained;questions answered;reassurance provided       Therapist:   Flavia ShipperSharda Zinedine Ellner, PT   Pager #: [phone]

## 2021-03-23 NOTE — Progress Notes (Signed)
Medical Intensive Care Unit  Progress Note     Name: Alexander, Richard, 61 y.o. male Date of Service:  03/23/2021   MRN  Date of Birth:  1960-02-25  [id number] Attending: Dione Booze*   PCP: Morton Amy, MD Chief Complaint: had no chief complaint listed for this encounter.   Code Status: Full Code Admitted for: Pneumonia     SUBJECTIVE:     Alexander Richard is a 61 y.o. male who presented to MICU 1 service on 03/22/21 from [place] with concerns of hemoptysis, fevers, and chills for the last 4 days. Pt notes covid exposure 1 week ago. Pt has had a right lung mass-like consolidation since 10/2020. He has undergone BAL that was negative for AFB/legionella at the [place].    On CT w/o contrast at the [place] the pt was found to have worsening right middle lobe. There is a new cavitary mass-like consolidation in the central  Superior right lower lobe. Concerning for cavitary pneumonia, intrapulmonary abscess, or neoplasm.     Overnight events:  No acute events overnight.  Patient remained hemodynamically stable since arrival to the ICU. Patient has not required pressors not fluids to maintain hemodynamic stability.     MEDICAL HISTORY:     PAST MEDICAL & SURGICAL HISTORIES:   No past medical history on file. No past surgical history on file.   HOME MEDICATIONS:  Outpatient Medications Marked as Taking for the 03/22/21 encounter [facility] Encounter)   Medication Sig   . albuterol sulfate (PROVENTIL OR VENTOLIN OR PROAIR) 90 mcg/actuation Inhalation oral inhaler Take 2 Puffs by inhalation Four times a day as needed   . DULoxetine (CYMBALTA DR) 60 mg Oral Capsule, Delayed Release(E.C.) Take 1 Capsule (60 mg total) by mouth Once a day   . gabapentin (NEURONTIN) 600 mg Oral Tablet Take 1 Tablet (600 mg total) by mouth Three times a day   . methocarbamoL (ROBAXIN) 750 mg Oral Tablet Take 1 Tablet (750 mg total) by mouth Twice daily   . tamsulosin (FLOMAX) 0.4 mg Oral Capsule Take 1 Capsule (0.4 mg total) by mouth  Every evening after dinner   . tiotropium-olodateroL (STIOLTO RESPIMAT) 2.5-2.5 mcg/actuation Inhalation Mist Take 2 INHALATION by inhalation Once a day      ALLERGIES:  He has No Known Allergies.      FAMILY HISTORY:  His family history is not on file.   SOCIAL HISTORY:  He  has no history on file for alcohol use. He  has no history on file for tobacco use. He  has no history on file for drug use.     ROS:      Negative except as noted in the HPI/Subjective.       OBJECTIVE:    VITAL SIGNS:   BP (Non-Invasive): 107/76 Heart Rate: 78   Respiratory Rate: 17 SpO2: 95 % Weight: 96.5 kg (212 lb 11.9 oz)        INTAKE & OUTPUT:  Net Since Admission: --    Intake/Output Summary (Last 24 hours) at 03/23/2021 1136  Last data filed at 03/23/2021 1021  Gross per 24 hour   Intake 957.62 ml   Output --   Net 957.62 ml     Date of Last Bowel Movement:  (PTA)         VENTILATOR SETTINGS:  Ventilator Settings:   room air          LINES & DRAINS:   Patient Lines/Drains/Airways Status  Active Line / Dialysis Catheter / Dialysis Graft / Drain / Airway / Wound     Name Placement date Placement time Site Days    Peripheral IV Ultrasound guided Right;Lower Cephalic  (lateral side of arm) 03/23/21  0215  -- less than 1    Midline Single Lumen Left 03/22/21  1639  --  less than 1                 Physical Exam  General: appears older than stated age, NAEO. Sitting in chair on arrival to room, conversational. Breathing comfortably on NC.  Eyes: Conjunctiva clear, Pupils equal and round, Sclera non-icteric  HENT: Head atraumatic and normocephalic, Mouth mucous membranes pink  Lungs: Clear to auscultation bilaterally, no wheezing or coarseness   Cardiovascular: Regular rate and rhythm, no murmur.  Abdomen: Soft, non-tender, non-distended  Extremities: No cyanosis or edema  Skin: Skin warm and dry  Neurologic: Alert and oriented x3    LABS  IMAGING  MICROBIOLOGY  PATHOLOGY:     I have reviewed all of the recent labs, imaging, microbiology  and pathology.    CBC Differential   Recent Labs     03/22/21  1655 03/23/21  0208   WBC 29.9* 25.5*   HGB 11.2* 12.2*   HCT 34.5* 37.5*   PLTCNT 334 275    Recent Labs     03/22/21  1655 03/23/21  0208   PMNS 90 88   MONOCYTES 5 6   BASOPHILS 0  0.10 0  <0.10   PMNABS 26.78* 22.57*   LYMPHSABS 0.67* 0.67*   MONOSABS 1.37* 1.42*   EOSABS <0.10 <0.10      BMP LFTs   Recent Labs     03/22/21  1655 03/23/21  0208   SODIUM 134* 134*   POTASSIUM 4.6 4.4   CHLORIDE 101 101   CO2 25 26   BUN 23 28*   CREATININE 0.67* 0.70*   GLUCOSENF 123 96   ANIONGAP 8 7   BUNCRRATIO 34* 40*   GFR >90 >90   CALCIUM 9.8 9.1   MAGNESIUM 1.9 1.9   PHOSPHORUS 4.1* 4.3*    No results found for this encounter   CoAgs Blood Gas:   No results found for this encounter No results found for this encounter    Cardiac Markers Lipid Panel   No results for input(s): TROPONINI, CKMB, MBINDEX, BNP in the last 72 hours. No results found for this encounter   Urine Analysis Other Labs   No results found for this encounter No results found for this encounter    Invalid input(s): PRL     Blood Cx 03/22/21 - pending    CT Chest 03/22/21 ([place]): worsening right middle lobe consolidation. New cavitary, mass-like consolidation in the central superior right lower lobe. Small loculated right sided pleural effusion. Enlarging right hilar and mediastinal lymphadenopathy    ASSESSMENT:     Alexander Richard is a 61 y.o. male with a past medical hx of spinal stenosis, chronic back pain, tobacco use, and GERd , who presented to MICU as a transfer from [facility] with new mass-like consolidation in right lower lung and recurrent pneumonias. Pt was hypotensive at the outside hospital but this has resolved.     No pressor nor fluids have been required to maintain hemodynamic stability.  Patient not with any ICU requirements at this time.     Active Hospital Problems    Diagnosis   . Primary  Problem: Pneumonia   . Sepsis (CMS [place])       PLAN:     Neurologic:  Hx  chronic back and neck pain  . Home medications: gabapentin 600 mg tid  . Restarted home medications after med rec with pharmacy this morning  . Alert and Oriented x 4    Cardiovascular:  Hypotension, resolved  . No fluids nor pressors required during this hospitalization  . Continue to monitor    Respiratory:  Concern for lung mass vs cavitary pna vs intrapulmonary abscess  . On nasal cannula, PEx with clear lungs throughout  . CT wet read: This study is compared to chest radiograph completed 03/22/2021. Redemonstration of focal airspace consolidation in the right middle and lower lobes with pockets of internal air suggestive of necrotic infection. The left lung is clear without focal consolidation. No pleural effusions. No visible pneumothorax. The central airways are patent. No bronchiectasis. Full dictation to follow.  . Plan for EBUS on 3/24 with pulmonary consults team  . MRSA swab negative  . Streptococcal and Legionella Urine Antigens negative  . Respiratory Biofire negative  . Vanc and zosyn, first doses given at [place]. Next dose 3/20 in the morning  o Given concern for cavitary pneumonia, recommend 6 weeks of antibiotics with repeat imaging for assessment for clearance  . Sputum cultures 3/19 pending  . Incentive Spirometry     GI/FEN:  No acute concerns   Regular diet    Renal:  No acute concerns  . Creatinine 0.70  . Monitoring I/Os    Infectious Diseases:  Recurrent Pneumonia  . WBC 25.5  . Blood cultures pending  . Sputum cultures showing oral flora   . Continue Vanc/zosyn for broad spectrum coverage     Endocrinologic:  No acute concerns     Hematology  No acute concerns    Psych:  PTSD  . Home meds: Cymbalta 60 mg daily   . Restarted home meds after med rec with pharmacy   ______________________________________________________________________     Diet: Regular diet   Fluids: None   Analgesia: Methocarbamol, gabapentin   Sedation: None   DVT/PE Prophylaxis: Lovenox   Ulcer Prophylaxis:  none   Glycemic Control: none   Bowel Regimen: Senokot   Consults: none   Hardware (Lines, Drains, Foley, Tubes): PIV    Activity: Up w/ assistance and Up in chair TID w/ all meals   Therapy: PT/OT       Disposition Planning - TBD    Azucena Fallen, MD  03/23/2021, 11:35      ----------------------------------------------------------------------------------------------------------------------  Attending Note:   DOS: 03/23/2021    I have seen and evaluated the patient with the medical staff, all labs, and imaging studies reviewed.   I agree with the findings including assessment and plan as documented in the resident's note.  Any exceptions/additions are noted .    Case was discussed in the multidiscipliary rounds.       Eula Flax MD  Associate Professor, [place]

## 2021-03-23 NOTE — Pharmacy (Signed)
[place] / Department of Pharmaceutical Services  Therapeutic Drug Monitoring: Vancomycin  03/23/2021      Patient name: Alexander Richard  Date of Birth:  01/07/1960    Actual Weight:  Weight: 96.5 kg (212 lb 11.9 oz) (03/22/21 1700)     BMI:  BMI (Calculated): 29.73 (03/22/21 1700)      Date RPh Current regimen (including mg/kg) Indication &  Organism AUC or trough based dosing Target Levels^ SCr (mg/dL) CrCl* (mL/min) Infectious Laboratory Markers (as applicable)   Measured level(s)   (mcg/mL) Calculated AUC (if AUC based monitoring) Plan & predicted AUC/trough if initial dosing (including when levels are due) Comments   3/19 eef Vanc 1000 mg x 1 @ 0500 at OSH empiric AUC 400-600 0.67 >100 WBC:  Procal:  CRP:   - Vanc 1250 mg q12h for anticipated AUC of 437  - f/u cultures and de-escalate as able  - obtain levels as clinically indicated    3/20 AJS  Vanc d/c                                                                                              ^Target levels depends on dosing and monitoring method, AUC vs. trough based. For AUC based dosing units are mg*h/L. For trough based dosing units are mcg/mL.     *Creatinine clearance is estimated by using the Cockcroft-Gault equation for adult patients and the Carol Ada for pediatric patients.    The decision to discontinue vancomycin therapy will be determined by the primary service.  Please contact the pharmacist with any questions regarding this patient's medication regimen.

## 2021-03-23 NOTE — Ancillary Notes (Signed)
Patient discussed with MICU1 service for evaluation for transfer and has been assigned to [facility] service at this time.      Thurnell Garbe., MD  03/23/2021, 09:26

## 2021-03-23 NOTE — Progress Notes (Signed)
MICU to [facility] Transfer Note    Patient is stable for transfer from MICU to stepdown, he is on O2 via NC, not requiring vasopressor or intubation    62 y.o male with PMH of spinal stenosis, GERD, recurrent PNA vs cavitary mass who presented to MICU via [facility] with hemoptysis, fever, and dyspnea    Cavitary R. Lung Mass vs Pneumonia (cannot rule-out gram negative) vs Abscess  Sepsis  COPD  -SIRS: WBC 25.5k, tachypnea, BP low-range (asymptomatic)  - on 2L O2 via NC this AM, wean further as tolerated and will need oxygen challenge on discharge (no previous need)  - CT imaging with consolidation and cavitation at RML and towards lower lose concerning for necrotizing infection  - MRSA negative, sputum culture with oral flora, strep & legionella Ag negative, Biofire viral assay negative  - blood cultures (3/20) pending  - pulmonary consult planning for EBUS on 03/27/21  - continue IV Zosyn for now (start: 3/20)  - Duoneb QID and albuterol nebs PRN  - aggressive pulmonary toiletry, mobilization, P&V, incentive spirometry    Nicotine Dependence  - counseled 7 minutes on smoking cessation  - continue NRT with 21 mg transdermal patch daily    Chronic Pain  Hx of Spinal Stenosis  - continue Neurontin 600 mg TID  - continue Robaxin 750 mg BID  - continue Cymbalta 60 mg daily    Orlene Erm, MD 12:55 03/23/2021  [place]  [phone]

## 2021-03-23 NOTE — Progress Notes (Signed)
Medical Intensive Care Unit  PROGRESS NOTE     Name: Alexander Richard, Alexander Richard, 61 y.o. male Date of service: 03/23/2021   Room: 15/A  LOS: 1    MRN: [id number] Attending: Eula Flax, MD   Code Status: Full Code Admitted to MICU for:  PNA     SUBJECTIVE  INTERVAL EVENTS:       Yesterday, Alexander Richard presented from [facility] due to concerns of hemoptysis, fevers, and chills for the last 4 days with new cavitary mass-like consolidation in the central superior right lower lobe. Overnight there were no events or issues. Patient has not required pressors not fluids to maintain hemodynamic        OBJECTIVE:   INTAKE & OUTPUT:  Net Since Admission:     Intake/Output Summary (Last 24 hours) at 03/23/2021 0735  Last data filed at 03/23/2021 0300  Gross per 24 hour   Intake 462 ml   Output --   Net 462 ml     Date of Last Bowel Movement:  (PTA)         VITAL SIGNS:  Temperature: 36.8 C (98.2 F) BP (Non-Invasive): 105/65 Heart Rate: 61   Respiratory Rate: (!) 10 SpO2: 99 % Weight: 96.5 kg (212 lb 11.9 oz)          DAILY EXAMINATION:  General: Appears older than age and in no acute distress  Eyes: Conjunctiva clear, sclera non-icteric  HENT: MM pink and moist  Lungs: CTA bilaterally, mildly diminished. 2L NC in place.  Cardiovascular: RRR with no murmurs appreciated   Abdomen: Soft, non-distended, and non-tender to palpation; bowel sounds normal.  Extremities: No cyanosis or edema  Integumentary: Skin warm and dry, No rashes  Neurologic: Grossly normal, alert and oriented x3       VENTILATOR SETTINGS:  Not on Ventilator       SEDATIVES/PRESSORS:  None        LINES & DRAINS:   Patient Lines/Drains/Airways Status     Active Line / Dialysis Catheter / Dialysis Graft / Drain / Airway / Wound     Name Placement date Placement time Site Days    Peripheral IV Ultrasound guided Right;Lower Cephalic  (lateral side of arm) 03/23/21  0215  -- less than 1    Midline Single Lumen Left 03/22/21  1639  --  less than 1                           LABS  IMAGING  MICROBIOLOGY  PATHOLOGY:     I have reviewed all of the recent labs, imaging, microbiology and pathology.    CBC Differential   Recent Labs     03/22/21  1655 03/23/21  0208   WBC 29.9* 25.5*   HGB 11.2* 12.2*   HCT 34.5* 37.5*   PLTCNT 334 275    Recent Labs     03/22/21  1655 03/23/21  0208   PMNS 90 88   MONOCYTES 5 6   BASOPHILS 0  0.10 0  <0.10   PMNABS 26.78* 22.57*   LYMPHSABS 0.67* 0.67*   MONOSABS 1.37* 1.42*   EOSABS <0.10 <0.10      BMP LFTs   Recent Labs     03/22/21  1655 03/23/21  0208   SODIUM 134* 134*   POTASSIUM 4.6 4.4   CHLORIDE 101 101   CO2 25 26   BUN 23 28*   CREATININE 0.67* 0.70*  GLUCOSENF 123 96   ANIONGAP 8 7   BUNCRRATIO 34* 40*   GFR >90 >90   CALCIUM 9.8 9.1   MAGNESIUM 1.9 1.9   PHOSPHORUS 4.1* 4.3*    No results found for this encounter   CoAgs Blood Gas:   No results found for this encounter No results found for this encounter    Cardiac Markers Lipid Panel   No results for input(s): TROPONINI, CKMB, MBINDEX, BNP in the last 72 hours. No results found for this encounter   Urine Analysis Other Labs   No results found for this encounter No results found for this encounter    Invalid input(s): PRL     Blood Cx 03/22/2021 - Pending     CXR 03/23/2021 -  Abnormal airspace opacities in the right mid and lower zone, concerning for multifocal consolidation in the current clinical setting.    ASSESSMENT:     Alexander Richard is a 61 y.o. male with a past medical hx of spinal stenosis, TUD, GERD, hx of right lung mass like consolidation and recurrent PNA's who presented to MICU from [facility] with hemoptysis, fevers, and chills for the last 4 days as well as a new cavitary mass-like consolidation in the central superior right lower lobe seen on CT. Patient was hypotensive at outside facility and allegedly required pressors but has not required any here. EBUS planned for 3/24. Patient not distressed at this time and not meeting ICU requirements.      Active  Hospital Problems    Diagnosis   . Primary Problem: Pneumonia   . Sepsis (CMS [place])       PLAN:     Neurologic:  No Acute Concerns  . Alert and Oriented x3   . Sedation: None  . Restarted home meds after med rec this am  o Gabapentin 600 mg TID  o Cymbalta 60 mg daily    Cardiovascular:  Hypotension - Resolved  . No pressors or fluids required  . Continue to monitor    Respiratory:  Concern for lung mass vs cavitary pna vs intrapulmonary abscess  . On 2L NC  . CT imaging from [place] significant for worsening right middle lobe consolidation. New cavitary, mass-like consolidation in the central superior right lower lobe. Small loculated right sided pleural effusion. Enlarging right hilar and mediastinal lymphadenopathy  . CT Wet read (03/23/21): This study is compared to chest radiograph completed 03/22/2021. Redemonstration of focal airspace consolidation in the right middle and lower lobes with pockets of internal air suggestive of necrotic infection. The left lung is clear without focal consolidation. No pleural effusions. No visible pneumothorax. The central airways are patent. No bronchiectasis. Full dictation to follow.  Marland Kitchen MRSA swab: Negative  . Sputum Culture: showing oral flora  . Streptococcal and Legionella Urine Antigens: Negative  . Respiratory Biofire: Negative  . Plan for EBUS 3/24 with pulmonary consult team  . Continue Zosyn, D/c Vancomycin  o Recommend 6 weeks of Abx with repeat imaging due to concern for cavitary PNA  . Incentive Spirometry      GI/FEN:  No Acute Concerns  ? Regular diet     Renal:  No Acute Concerns  . Creatinine 0.67  . Strict I/O     Endocrinologic:  No Acute Concerns    Hematology  No Acute Concerns     Infectious Diseases:  Recurrent PNA  Cavitary Consolidation R Lower Lobe  . WBC: 25.5 was 29.9  . Blood cultures: Pending  .  Sputum Culture (3/19): showing oral flora  . BP improved, currently not requiring pressor support  . Vanc + Zosyn for broad spectrum coverage started at [facility] and  2nd dose given this am  o Now discontinuing Vanc, cont Zosyn    Psychologic/Social:   PTSD  . Home Med: Cymbalta 60 mg daily restarted after med rec with pharmacy  . Prognosis is fair    _________________________________________________________________________________     Diet: Regular   Fluids: None   Analgesia: Methocarbamol, Gabapentin   Sedation: None   DVT/PE Prophylaxis: Lovenox   Ulcer Prophylaxis: None   Glycemic Control: None   Bowel Regimen: Senokot   Consults: None   Hardware (Lines, Drains, Foley, Tubes): PIV    Activity: Up w/ assistance and Up in chair TID w/ all meals   Therapy: PT/OT     Disposition Planning - TBD      Jannetta Quint, MSIV, 03/23/2021, 11:49      ----------------------------------------------------------------------------------------------------------------------  Attending Note:   I was present when the student was taking history, performing the exam, and during any medical decision making activities. I personally verified the history, performed the exam, and medical decision making as edited in the student's note. I agree with the medical student's note.

## 2021-03-24 DIAGNOSIS — G8929 Other chronic pain: Secondary | ICD-10-CM

## 2021-03-24 DIAGNOSIS — Z720 Tobacco use: Secondary | ICD-10-CM

## 2021-03-24 DIAGNOSIS — M4800 Spinal stenosis, site unspecified: Secondary | ICD-10-CM

## 2021-03-24 LAB — MANUAL DIFF AND MORPHOLOGY-SYSMEX
BASOPHIL #: <0.1 10*3/uL (ref <=0.20)
BASOPHIL %: 0 %
EOSINOPHIL #: <0.1 10*3/uL (ref <=0.50)
EOSINOPHIL %: 0 %
LYMPHOCYTE #: 2.71 10*3/uL (ref 1.00–4.80)
LYMPHOCYTE %: 11 %
MONOCYTE #: 1.72 10*3/uL — ABNORMAL HIGH (ref 0.20–1.10)
MONOCYTE %: 7 %
NEUTROPHIL #: 20.17 10*3/uL — ABNORMAL HIGH (ref 1.50–7.70)
NEUTROPHIL %: 81 %
NEUTROPHIL BANDS %: 1 %
RBC MORPHOLOGY: NORMAL

## 2021-03-24 LAB — CBC WITH DIFF
HCT: 33.7 % — ABNORMAL LOW (ref 38.9–52.0)
HGB: 11.1 g/dL — ABNORMAL LOW (ref 13.4–17.5)
MCH: 26.8 pg (ref 26.0–32.0)
MCHC: 32.9 g/dL (ref 31.0–35.5)
MCV: 81.4 fL (ref 78.0–100.0)
MPV: 9.3 fL (ref 8.7–12.5)
PLATELETS: 328 10*3/uL (ref 150–400)
RBC: 4.14 10*6/uL — ABNORMAL LOW (ref 4.50–6.10)
RDW-CV: 15.8 % — ABNORMAL HIGH (ref 11.5–15.5)
WBC: 24.6 10*3/uL — ABNORMAL HIGH (ref 3.7–11.0)

## 2021-03-24 LAB — BASIC METABOLIC PANEL
ANION GAP: 4 mmol/L (ref 4–13)
BUN/CREA RATIO: 49 — ABNORMAL HIGH (ref 6–22)
BUN: 32 mg/dL — ABNORMAL HIGH (ref 8–25)
CALCIUM: 9.4 mg/dL (ref 8.8–10.2)
CHLORIDE: 104 mmol/L (ref 96–111)
CO2 TOTAL: 26 mmol/L (ref 23–31)
CREATININE: 0.65 mg/dL — ABNORMAL LOW (ref 0.75–1.35)
ESTIMATED GFR: >90 mL/min/BSA (ref >=60)
GLUCOSE: 108 mg/dL (ref 65–125)
POTASSIUM: 4.9 mmol/L (ref 3.5–5.1)
SODIUM: 134 mmol/L — ABNORMAL LOW (ref 136–145)

## 2021-03-24 LAB — RESPIRATORY CULTURE AND GRAM STAIN (PERFORMABLE): RESPIRATORY CULTURE: NORMAL

## 2021-03-24 LAB — MAGNESIUM: MAGNESIUM: 2 mg/dL (ref 1.8–2.6)

## 2021-03-24 LAB — ADULT ROUTINE BLOOD CULTURE, SET OF 2 BOTTLES (BACTERIA AND YEAST): BLOOD CULTURE, ROUTINE: NO GROWTH

## 2021-03-24 MED ORDER — ARFORMOTEROL 15 MCG/2 ML SOLUTION FOR NEBULIZATION
15.0000 ug | INHALATION_SOLUTION | Freq: Two times a day (BID) | RESPIRATORY_TRACT | Status: DC
Start: 2021-03-24 — End: 2021-04-02
  Administered 2021-03-24 – 2021-03-25 (×3): 15 ug via RESPIRATORY_TRACT
  Administered 2021-03-25: 0 ug via RESPIRATORY_TRACT
  Administered 2021-03-26 (×2): 15 ug via RESPIRATORY_TRACT
  Administered 2021-03-27: 0 ug via RESPIRATORY_TRACT
  Administered 2021-03-27 – 2021-04-02 (×12): 15 ug via RESPIRATORY_TRACT
  Filled 2021-03-24: qty 1
  Filled 2021-03-24: qty 2

## 2021-03-24 MED ORDER — KETOROLAC 30 MG/ML (1 ML) INJECTION SOLUTION
30.0000 mg | INTRAMUSCULAR | Status: AC
Start: 2021-03-25 — End: 2021-03-24
  Administered 2021-03-24: 30 mg via INTRAVENOUS
  Filled 2021-03-24: qty 1

## 2021-03-24 MED ORDER — ARFORMOTEROL 15 MCG/2 ML SOLUTION FOR NEBULIZATION
INHALATION_SOLUTION | RESPIRATORY_TRACT | Status: AC
Start: 2021-03-24 — End: 2021-03-24
  Filled 2021-03-24: qty 1

## 2021-03-24 MED ORDER — TAMSULOSIN 0.4 MG CAPSULE
0.4000 mg | ORAL_CAPSULE | Freq: Every evening | ORAL | Status: DC
Start: 2021-03-24 — End: 2021-04-02
  Administered 2021-03-24 – 2021-04-01 (×9): 0.4 mg via ORAL
  Filled 2021-03-24 (×9): qty 1

## 2021-03-24 MED ORDER — ACETAMINOPHEN 325 MG TABLET
650.0000 mg | ORAL_TABLET | ORAL | Status: DC | PRN
Start: 2021-03-24 — End: 2021-04-02
  Administered 2021-03-24 – 2021-03-25 (×4): 650 mg via ORAL
  Filled 2021-03-24 (×4): qty 2

## 2021-03-24 MED ORDER — TIOTROPIUM BROMIDE 2.5 MCG/ACTUATION MIST FOR INHALATION - RN
2.0000 | Freq: Every day | RESPIRATORY_TRACT | Status: DC
Start: 2021-03-24 — End: 2021-04-02
  Administered 2021-03-24 – 2021-04-02 (×10): 2 via RESPIRATORY_TRACT
  Filled 2021-03-24 (×2): qty 4

## 2021-03-24 MED ORDER — KETOROLAC 30 MG/ML (1 ML) INJECTION SOLUTION
30.0000 mg | Freq: Once | INTRAMUSCULAR | Status: AC | PRN
Start: 2021-03-24 — End: 2021-03-24
  Administered 2021-03-24: 30 mg via INTRAVENOUS
  Filled 2021-03-24: qty 1

## 2021-03-24 NOTE — Progress Notes (Signed)
[facility]  Medicine Progress Note  Full Code    Ermalinda MemosMichael E Blatchford  Date of service: 03/24/2021    Subjective:   Patient feels breathing is stable though is on 4L O2 via NC (no previous baseline) and is having difficulty producing sputum with cough. He is amenable to plan for bronchoscopy / EBUS. Patient denies chest pain but does have right flank pain this morning. He denies subjective fever and nausea.    Vital Signs:  Temp (24hrs) Max:36.4 C (97.5 F)      Systolic (24hrs), Avg:108 , Min:90 , Max:132     Diastolic (24hrs), Avg:75, Min:55, Max:96    Temp  Avg: 36.3 C (97.3 F)  Min: 36 C (96.8 F)  Max: 36.4 C (97.5 F)  MAP (Non-Invasive)  Avg: 86.7 mmHG  Min: 70 mmHG  Max: 109 mmHG  Pulse  Avg: 73.3  Min: 59  Max: 90  Resp  Avg: 21.6  Min: 15  Max: 41  SpO2  Avg: 95.1 %  Min: 92 %  Max: 100 %       I/O:  I/O last 24 hours:    Intake/Output Summary (Last 24 hours) at 03/24/2021 0713  Last data filed at 03/24/2021 0424  Gross per 24 hour   Intake 795.62 ml   Output --   Net 795.62 ml     I/O current shift:  No intake/output data recorded.    acetaminophen (TYLENOL) tablet, 650 mg, Oral, Q4H PRN  albuterol (PROVENTIL) 2.5 mg / 3 mL (0.083%) neb solution, 2.5 mg, Nebulization, Q4H PRN  D5W 250 mL flush bag, , Intravenous, Q15 Min PRN  DULoxetine (CYMBALTA) delayed release capsule, 60 mg, Oral, Daily  enoxaparin PF (LOVENOX) 40 mg/0.4 mL SubQ injection, 40 mg, Subcutaneous, Daily  gabapentin (NEURONTIN) capsule, 600 mg, Oral, 3x/day  ketorolac (TORADOL) 30 mg/mL injection, 30 mg, Intravenous, Once PRN  methocarbamol (ROBAXIN) tablet, 750 mg, Oral, 2x/day  nicotine (NICODERM CQ) transdermal patch (mg/24 hr), 21 mg, Transdermal, Daily  NS 250 mL flush bag, , Intravenous, Q15 Min PRN  NS flush syringe, 2-6 mL, Intracatheter, Q8HRS  NS flush syringe, 2-6 mL, Intracatheter, Q1 MIN PRN  piperacillin-tazobactam (ZOSYN) 4.5 g in NS 100 mL IVPB, 4.5 g, Intravenous, Q8H  sennosides-docusate sodium (SENOKOT-S)  8.6-50mg  per tablet, 1 Tablet, Oral, 2x/day        No Known Allergies    Physical Exam:  General: appears stated age, sitting in chair, no distress  Eyes: conjunctiva clear, pupils equal and round  HENT: head atraumatic and normocephalic, moist oral mucosea  Lungs: expiratory wheezes over right lung field, no appreciable crackles  Cardiovascular: regular rate and rhythm, no murmurs, no rubs, no gallops  Abdomen: soft, non-tender, non-distended, bowel sounds present  Extremities: no cyanosis, no LE edema  Skin: skin warm and dry, no rashes, multiple tattoos  Neurologic: AOx3, no gross deficits    Labs:  I have reviewed all lab results.  Lab Results Today:    Results for orders placed or performed during the hospital encounter of 03/22/21 (from the past 24 hour(s))   MAGNESIUM   Result Value Ref Range    MAGNESIUM 2.0 1.8 - 2.6 mg/dL   BASIC METABOLIC PANEL   Result Value Ref Range    SODIUM 134 (L) 136 - 145 mmol/L    POTASSIUM 4.9 3.5 - 5.1 mmol/L    CHLORIDE 104 96 - 111 mmol/L    CO2 TOTAL 26 23 - 31 mmol/L  ANION GAP 4 4 - 13 mmol/L    CALCIUM 9.4 8.8 - 10.2 mg/dL    GLUCOSE 503 65 - 888 mg/dL    BUN 32 (H) 8 - 25 mg/dL    CREATININE 2.80 (L) 0.75 - 1.35 mg/dL    BUN/CREA RATIO 49 (H) 6 - 22    ESTIMATED GFR >90 >=60 mL/min/BSA   CBC WITH DIFF   Result Value Ref Range    WBC 24.6 (H) 3.7 - 11.0 x10^3/uL    RBC 4.14 (L) 4.50 - 6.10 x10^6/uL    HGB 11.1 (L) 13.4 - 17.5 g/dL    HCT 03.4 (L) 91.7 - 52.0 %    MCV 81.4 78.0 - 100.0 fL    MCH 26.8 26.0 - 32.0 pg    MCHC 32.9 31.0 - 35.5 g/dL    RDW-CV 91.5 (H) 05.6 - 15.5 %    PLATELETS 328 150 - 400 x10^3/uL    MPV 9.3 8.7 - 12.5 fL   MANUAL DIFF AND MORPHOLOGY-SYSMEX   Result Value Ref Range    NEUTROPHIL % 81 %    LYMPHOCYTE %  11 %    MONOCYTE % 7 %    EOSINOPHIL % 0 %    BASOPHIL % 0 %    NEUTROPHIL BANDS % 1 %    NEUTROPHIL # 20.17 (H) 1.50 - 7.70 x10^3/uL    LYMPHOCYTE # 2.71 1.00 - 4.80 x10^3/uL    MONOCYTE # 1.72 (H) 0.20 - 1.10 x10^3/uL    EOSINOPHIL # <0.10  <=0.50 x10^3/uL    BASOPHIL # <0.10 <=0.20 x10^3/uL    RBC MORPHOLOGY Normal RBC and PLT Morphology        Radiology:    I have reviewed imaging results  Results for orders placed or performed during the hospital encounter of 03/22/21 (from the past 72 hour(s))   XR AP MOBILE CHEST     Status: None    Narrative    XR AP MOBILE CHEST performed on 03/22/2021 5:04 PM    INDICATION: 61 years old Male; pneumonia    TECHNIQUE: 1 views of the chest; 1 images    COMPARISON: None available    FINDINGS:  The heart is normal in size. Abnormal airspace opacities in the right mid   and lower zone, concerning for multifocal consolidation in the current   clinical setting. No significant pleural effusion or pneumothorax.      Impression    Abnormal airspace opacities in the right mid and lower zone, concerning   for multifocal consolidation in the current clinical setting.     CT CHEST W IV CONTRAST     Status: None    Narrative    Kalin E Voland    Male, 61 years old.    CT CHEST W IV CONTRAST performed on 03/23/2021 1:14 AM.    REASON FOR EXAM:  Worsening RML consolidation    RADIATION DOSE: 227.05 mGy.cm    CONTRAST: 80 ml's of Isovue 370    TECHNIQUE: Contrast-enhanced CT chest was performed. Multiplanar reformats provided.    COMPARISON: None.    FINDINGS:     LUNGS: Hyperinflation and emphysematous changes are present. Masslike consolidation occupies the right lower lobe with confluent extension to the right middle lobe. Right lower lobe component abuts the pleura measures at least 7.9 x 7 x 5.5 cm. There is internal air noted which may reflect developing necrosis or abscess. There is opacification of the right middle lobe with volume loss.  PLEURA: Minimal right pleural thickening and/or reaction is noted. No drainable pleural collection is present.    MEDIASTINUM: Thyroid is not well assessed by CT imaging. There are a few borderline-enlarged mediastinal lymph nodes with enlarged right hilar lymph node evident. These  may be reactive. Malignancy not excluded.    HEART AND GREAT VESSELS: No pericardial effusion. There is atherosclerotic disease and coronary vascular calcification present.    ABDOMEN: Limited assessment of the upper abdomen demonstrates partial visualization of the solid organs. Mild adrenal thickening noted.    OTHER: There is degenerative change of the spine included osseous structures. Exaggerated thoracic kyphosis is evident.      Impression    1. Regions of masslike airspace consolidation occupying the right middle and lower lobes. Regions of focal internal air suggest underlying necrosis or developing abscess which can be seen with infection or malignancy. Comparison to any recent previous imaging would be of benefit, otherwise short-term follow-up is recommended after a period of therapy. Background changes of hyperinflation and emphysematous change.  2. Minimal right-sided pleural thickening and/or fluid. Borderline to mildly enlarged mediastinal and right hilar lymph nodes may be reactive assessment on short-term follow-up is recommended.  3. Atherosclerotic disease and coronary vascular calcification.           PT/OT: No    Consults: pulmonary    Assessment/ Plan:   Active Hospital Problems    Diagnosis   . Primary Problem: Pneumonia   . Sepsis (CMS HCC)       61 y.o male with PMH of spinal stenosis, GERD, recurrent PNA vs cavitary mass who presented to MICU via [facility] with hemoptysis, fever, and dyspnea    Cavitary R. Lung Mass vs Pneumonia (cannot rule-out gram negative) vs Abscess  Sepsis  COPD  -SIRS improved, WBC 25.5k-->24.6k  - on 4L O2 via NC this AM, wean further as tolerated and will need oxygen challenge on discharge (no previous need)  - CT imaging with consolidation and cavitation at RML and towards lower lose concerning for necrotizing infection  - MRSA negative, sputum culture with oral flora, strep & legionella Ag negative, Biofire viral assay negative  - blood cultures (3/20)  NGx24h  - pulmonary consult, appreciate recs    - planning for EBUS this week    - add Brovana nebulizer BID and Spiriva daily  - Duoneb QID and albuterol nebs PRN  - continue IV Zosyn for now (start: 3/20)  - aggressive pulmonary toiletry, mobilization, P&V, incentive spirometry  - Tylenol and IV Toradol PRN for pain    Nicotine Dependence  - (3/20) counseled 7 minutes on smoking cessation  - continue NRT with 21 mg transdermal patch daily    Chronic Pain  Hx of Spinal Stenosis  - continue Neurontin 600 mg TID  - continue Robaxin 750 mg BID  - continue Cymbalta 60 mg daily    DVT/PE Prophylaxis: Enoxaparin  Disposition Planning: Home discharge      Asencion Noble, MD 07:13 03/24/2021  Hospitalist  [facility]    On 03/24/2021 I spent a total visit time of . Time included review of tests and ordering tests, obtaining/reviewing history, examining the patient, communicating with consultants, documenting clinical information and counseling the patient and/or family regarding the diagnosis and management plan and coordination of care involved services directly related to patient care.    FOLLOW UP NOTE LEVEL 3 (TOTAL TIME > 50 MINUTES) ([id number])

## 2021-03-24 NOTE — Care Management Notes (Signed)
[facility]  Care Management Note    Patient Name: Alexander Richard  Date of Birth: [birth year]  Sex: male  Date/Time of Admission: 03/22/2021  4:29 PM  Room/Bed: 15/A  Payor: [facility] [employer]N COMMUNITY CARE / Plan: Evette Doffing VACCN/OPTUM / Product Type: Managed Care /    LOS: 2 days   Primary Care Providers:  Morton Amy, MD, MD (General)    Admitting Diagnosis:  Sepsis (CMS [facility]) [A41.9]    Assessment:      03/24/21 1350   Assessment Details   Assessment Type Admission   Date of Care Management Update 03/24/21   Date of Next DCP Update 03/27/21   Readmission   Is this a readmission? No   Insurance Information/Type   Insurance type Other (see comments)  (Carollee Herter, [facility] Chief Operating Officer)   Comment (Other Insurance): [facility]   Employment/Financial   Patient has Prescription Coverage?  Yes  ([facility])        Name of Insurance Coverage for Medications VACCCN   Financial Concerns none   Living Environment   Select an age group to open "lives with" row.  Adult   Lives With spouse   Living Arrangements house   Able to Return to Prior Arrangements yes   IEP and/or 504 Plan? No   Home Safety   Home Assessment: No Problems Identified   Home Accessibility stairs within home;bed and bath on same level   Custody and Legal Status   Do you have a court appointed guardian/conservator? No   Are you an emancipated minor? No   Custody Issues? No   Paternity Affidavit Requested? No   Care Management Plan   Discharge Planning Status initial meeting   Projected Discharge Date 03/27/21   CM will evaluate for rehabilitation potential yes   Patient choice offered to patient/family no   Form for patient choice reviewed/signed and on chart no   Patient aware of possible cost for ambulance transport?  No   Discharge Needs Assessment   Equipment Currently Used at Home none   Equipment Needed After Discharge other (see comments)  (to be determined)   Discharge Facility/Level of Care Needs Home (Patient/Family Member/other)(code 1)    Transportation Available other (see comments)  (to be determined)   Referral Information   Admission Type inpatient   Address Verified verified-no changes   Arrived From acute hospital, other   [place]   [facility])   ADVANCE DIRECTIVES   Does the Patient have an Advance Directive? No, Information Offered and Refused   Patient Requests Assistance in Having Advance Directive Notarized. N/A   LAY CAREGIVER    Appointed Lay Caregiver? I Decline       MSW completed assessment with pt at bedside.  Pt lives in two story home with no steps to enter.  Pt stated he uses [facility] for everything-PCP, mail pharmacy.  Pt does not have DME or home health. Pt is not employed.  Pt stated he was working on ride home as his Spouse has medical issues and cant come to get him.     Pulmonary consulted for EBUS with biopsy.  Pulmonary toilet. PT recommended home with assist at discharge. . IV ABX.    Discharge Plan:  Home (Patient/Family Member/other) (code 1)      The patient will continue to be evaluated for developing discharge needs.     Case Manager: Vertell Limber  Phone: [phone]

## 2021-03-24 NOTE — Consults (Signed)
[facility]  Pulmonary Consult Follow-Up Note     Alexander Richard, Alexander Richard, 61 y.o. male  Date of Service: 03/24/2021  Date of Birth:  [birth year]    Assessment/Recommendations:  Alexander Richard is a 61 y/o male with PMH of spinal stenosis/chronic back pain, GERD, and tobacco use who was admitted to [facility] on 03/22/2021 as a transfer from [facility] for acute hypoxic respiratory failure in the setting of mass-like airspace consolidation occupying the right middle and lower lobes (with regions of focal internal air suggestive of underlying necrosis or developing abscess). Pulmonary medicine has been consulted for EBUS with biopsy.    Acute hypoxic respiratory failure  Right middle and lower lobe mass-like consolidation, concern for malignancy vs infection  Possible underlying COPD  Tobacco use    Will plan for EBUS with biopsy, timing to be determined (may occur sooner than 03/27/2021 depending on anesthesia availability - we will keep you updated).  Agree with antibiotic therapy as ordered.  Recommend adding Brovana + Spiriva at this time (orders placed). Can continue PRN albuterol for wheezing and shortness of breath.  Continue aggressive pulmonary toilet, frequent incentive spirometry Q1hr while awake (with frequent reminders from nursing staff/RT), OOB to chair at least TID, and PT/OT evaluations if not already completed.    Thank you for this consultation. We will continue to follow. Please do not hesitate to reach out to our service should you have any additional questions or concerns.    Chief Complaint: Hemoptysis, shortness of breath, fever  Subjective: Patient states that he feels much better today. He is continuing to have some pleuritic chest pain on the right side. Otherwise denies any new symptoms or complaints.    Objective:  Temperature: 36.4 C (97.5 F)  Heart Rate: 63  BP (Non-Invasive): 114/87  Respiratory Rate: 14  SpO2: 97 %  General: Alert, pleasant. In no acute  distress.  HEENT: EOMI, normal conjunctiva/sclera. MMM. Oropharynx clear.  Neck: Supple, trachea midline.  Respiratory: Scattered, subtle expiratory wheezing appreciated. Normal work of breathing on NC.  Cardiac: Regular rate and rhythm. No murmurs appreciated.  GI: Soft, non-tender, and non-distended abdomen.  MSK: Moves all extremities. Distal pulses intact. No peripheral edema.  Skin: Warm and dry without visible rashes. Multiple tattoos noted over the extremities.  Neuro: Alert and oriented x3. No obvious focal deficits.      Labs: I have reviewed all recent lab results. Leukocytosis still noted, but appears to be slowly resolving. BMP largely unremarkable.    Imaging Studies: I have reviewed all recent imaging results. CT chest with IV contrast from 03/22/2021: 1. Regions of masslike airspace consolidation occupying the right middle and lower lobes. Regions of focal internal air suggest underlying necrosis or developing abscess which can be seen with infection or malignancy. Comparison to any recent previous imaging would be of benefit, otherwise short-term follow-up is recommended after a period of therapy. Background changes of hyperinflation and emphysematous change. 2. Minimal right-sided pleural thickening and/or fluid. Borderline to mildly enlarged mediastinal and right hilar lymph nodes may be reactive assessment on short-term follow-up is recommended. 3. Atherosclerotic disease and coronary vascular calcification.    Roxine Caddy, MD  PGY-3 Internal Medicine      I saw and examined the patient.  I reviewed the fellow's note.  I agree with the findings and plan of care as documented in the fellow's note.  Any exceptions/additions are edited/noted.    Hilary Hertz, MD  Assistant Professor, Section of  Pulmonary, Critical Care, and Sleep Medicine  [place] [facility]  03/24/2021 13:18

## 2021-03-24 NOTE — Care Plan (Signed)
PT attempted to provide care to Alexander Richard today at 14:29. Care could not be delivered at that time due to  pt states feeling very fatigued and wants to rest,. RN agreed to let pt rest and RN would attempt amb later this evening. PT to follow as appropriate.        03/24/21 1425   Therapist Pager   PT Assigned/ Pager # [phone]   Rehab Session   Document Type rehab contact note   Total PT Minutes: 0   Basic Mobility Am-PAC/6Clicks Score (APPROVED PT Staff, [facility], [facility] Nursing ONLY, [facility], [facility], and [facility])   Patient Mobility Barrier Patient declined treatment   Physical Therapy Clinical Impression   Criteria for Skilled Therapeutic yes

## 2021-03-25 DIAGNOSIS — E871 Hypo-osmolality and hyponatremia: Secondary | ICD-10-CM

## 2021-03-25 DIAGNOSIS — F1920 Other psychoactive substance dependence, uncomplicated: Secondary | ICD-10-CM

## 2021-03-25 DIAGNOSIS — R079 Chest pain, unspecified: Secondary | ICD-10-CM

## 2021-03-25 DIAGNOSIS — R509 Fever, unspecified: Secondary | ICD-10-CM

## 2021-03-25 DIAGNOSIS — Z8739 Personal history of other diseases of the musculoskeletal system and connective tissue: Secondary | ICD-10-CM

## 2021-03-25 DIAGNOSIS — R042 Hemoptysis: Secondary | ICD-10-CM

## 2021-03-25 DIAGNOSIS — R0600 Dyspnea, unspecified: Secondary | ICD-10-CM

## 2021-03-25 LAB — MANUAL DIFF AND MORPHOLOGY-SYSMEX
BASOPHIL #: <0.1 10*3/uL (ref <=0.20)
BASOPHIL %: 0 %
EOSINOPHIL #: 0.19 10*3/uL (ref <=0.50)
EOSINOPHIL %: 1 %
LYMPHOCYTE #: 3.01 10*3/uL (ref 1.00–4.80)
LYMPHOCYTE %: 16 %
METAMYELOCYTE %: 2 %
MONOCYTE #: 1.32 10*3/uL — ABNORMAL HIGH (ref 0.20–1.10)
MONOCYTE %: 7 %
MYELOCYTE %: 2 %
NEUTROPHIL #: 13.72 10*3/uL — ABNORMAL HIGH (ref 1.50–7.70)
NEUTROPHIL %: 72 %
NEUTROPHIL BANDS %: 1 %
RBC MORPHOLOGY: NORMAL

## 2021-03-25 LAB — MAGNESIUM: MAGNESIUM: 1.7 mg/dL — ABNORMAL LOW (ref 1.8–2.6)

## 2021-03-25 LAB — BASIC METABOLIC PANEL
ANION GAP: 6 mmol/L (ref 4–13)
BUN/CREA RATIO: 32 — ABNORMAL HIGH (ref 6–22)
BUN: 21 mg/dL (ref 8–25)
CALCIUM: 9.5 mg/dL (ref 8.8–10.2)
CHLORIDE: 98 mmol/L (ref 96–111)
CO2 TOTAL: 27 mmol/L (ref 23–31)
CREATININE: 0.66 mg/dL — ABNORMAL LOW (ref 0.75–1.35)
ESTIMATED GFR: >90 mL/min/BSA (ref >=60)
GLUCOSE: 88 mg/dL (ref 65–125)
POTASSIUM: 4.6 mmol/L (ref 3.5–5.1)
SODIUM: 131 mmol/L — ABNORMAL LOW (ref 136–145)

## 2021-03-25 LAB — CBC WITH DIFF
HCT: 36.1 % — ABNORMAL LOW (ref 38.9–52.0)
HGB: 11.8 g/dL — ABNORMAL LOW (ref 13.4–17.5)
MCH: 26.8 pg (ref 26.0–32.0)
MCHC: 32.7 g/dL (ref 31.0–35.5)
MCV: 81.9 fL (ref 78.0–100.0)
MPV: 8.8 fL (ref 8.7–12.5)
PLATELETS: 328 10*3/uL (ref 150–400)
RBC: 4.41 10*6/uL — ABNORMAL LOW (ref 4.50–6.10)
RDW-CV: 15.7 % — ABNORMAL HIGH (ref 11.5–15.5)
WBC: 18.8 10*3/uL — ABNORMAL HIGH (ref 3.7–11.0)

## 2021-03-25 LAB — OSMOLALITY: OSMOLALITY, BLOOD: 289 mOsm/kg (ref 280–300)

## 2021-03-25 LAB — SODIUM: SODIUM: 131 mmol/L — ABNORMAL LOW (ref 136–145)

## 2021-03-25 LAB — SODIUM, RANDOM URINE: SODIUM RANDOM URINE: 138 mmol/L

## 2021-03-25 LAB — PROCALCITONIN REFLEX 6HR: PROCALCITONIN: 0.07 ng/mL (ref <0.50)

## 2021-03-25 LAB — PROCALCITONIN ALGORITHM: PROCALCITONIN: 0.1 ng/mL (ref <0.50)

## 2021-03-25 LAB — OSMOLALITY, RANDOM URINE: OSMOLALITY URINE: 607 mOsm/kg (ref 50–1400)

## 2021-03-25 MED ORDER — MAGNESIUM SULFATE 2 GRAM/50 ML (4 %) IN WATER INTRAVENOUS PIGGYBACK
2.0000 g | INJECTION | Freq: Once | INTRAVENOUS | Status: AC
Start: 2021-03-25 — End: 2021-03-25
  Administered 2021-03-25: 0 g via INTRAVENOUS
  Administered 2021-03-25: 2 g via INTRAVENOUS
  Filled 2021-03-25: qty 50

## 2021-03-25 MED ORDER — SODIUM CHLORIDE 0.9 % INTRAVENOUS SOLUTION
INTRAVENOUS | Status: DC
Start: 2021-03-25 — End: 2021-03-29
  Administered 2021-03-29: 0 mL via INTRAVENOUS

## 2021-03-25 MED ORDER — METHOCARBAMOL 750 MG TABLET
750.0000 mg | ORAL_TABLET | Freq: Four times a day (QID) | ORAL | Status: DC
Start: 2021-03-25 — End: 2021-04-02
  Administered 2021-03-25 – 2021-03-27 (×8): 750 mg via ORAL
  Administered 2021-03-27: 0 mg via ORAL
  Administered 2021-03-27 – 2021-04-02 (×24): 750 mg via ORAL
  Filled 2021-03-25 (×33): qty 1

## 2021-03-25 MED ORDER — MORPHINE 2 MG/ML INTRAVENOUS SYRINGE
2.0000 mg | INJECTION | INTRAVENOUS | Status: DC | PRN
Start: 2021-03-25 — End: 2021-04-01
  Administered 2021-03-25 – 2021-04-01 (×19): 2 mg via INTRAVENOUS
  Filled 2021-03-25 (×19): qty 1

## 2021-03-25 MED ORDER — TRAMADOL 50 MG TABLET
50.0000 mg | ORAL_TABLET | Freq: Four times a day (QID) | ORAL | Status: DC | PRN
Start: 2021-03-25 — End: 2021-04-02
  Administered 2021-03-25 – 2021-03-29 (×6): 50 mg via ORAL
  Administered 2021-03-29: 0 mg via ORAL
  Administered 2021-03-29 – 2021-04-01 (×6): 50 mg via ORAL
  Filled 2021-03-25 (×13): qty 1

## 2021-03-25 MED ORDER — LIDOCAINE 3.6 %-MENTHOL 1.25 % TOPICAL PATCH
1.0000 | MEDICATED_PATCH | Freq: Every day | CUTANEOUS | Status: DC
Start: 2021-03-25 — End: 2021-04-02
  Administered 2021-03-25: 1 via TRANSDERMAL
  Administered 2021-03-26: 0 via TRANSDERMAL
  Administered 2021-03-27 – 2021-03-31 (×5): 1 via TRANSDERMAL
  Administered 2021-04-01 – 2021-04-02 (×2): 0 via TRANSDERMAL
  Filled 2021-03-25 (×9): qty 1

## 2021-03-25 MED ORDER — MORPHINE 2 MG/ML INTRAVENOUS SYRINGE
2.0000 mg | INJECTION | INTRAVENOUS | Status: AC
Start: 2021-03-25 — End: 2021-03-25
  Administered 2021-03-25: 2 mg via INTRAVENOUS
  Filled 2021-03-25: qty 1

## 2021-03-25 MED ORDER — ARFORMOTEROL 15 MCG/2 ML SOLUTION FOR NEBULIZATION
INHALATION_SOLUTION | RESPIRATORY_TRACT | Status: AC
Start: 2021-03-25 — End: 2021-03-25
  Filled 2021-03-25: qty 1

## 2021-03-25 NOTE — Care Plan (Signed)
Problem: Adult Inpatient Plan of Care  Goal: Plan of Care Review  Outcome: Ongoing (see interventions/notes)  Goal: Patient-Specific Goal (Individualized)  Outcome: Ongoing (see interventions/notes)  Flowsheets (Taken 03/24/2021 2035)  Individualized Care Needs: PRN meds given  Anxieties, Fears or Concerns: Pain control  Patient-Specific Goals (Include Timeframe): Ensure that pt is safe, comfortable and pain to be at minimum  Plan of Care Reviewed With: patient  Goal: Absence of Hospital-Acquired Illness or Injury  Outcome: Ongoing (see interventions/notes)  Intervention: Identify and Manage Fall Risk  Recent Flowsheet Documentation  Taken 03/25/2021 0438 by Marcelo Baldy, RN  Safety Promotion/Fall Prevention: safety round/check completed  Taken 03/25/2021 0236 by Marcelo Baldy, RN  Safety Promotion/Fall Prevention: safety round/check completed  Taken 03/25/2021 0016 by Marcelo Baldy, RN  Safety Promotion/Fall Prevention: safety round/check completed  Taken 03/24/2021 2200 by Marcelo Baldy, RN  Safety Promotion/Fall Prevention:   safety round/check completed   nonskid shoes/slippers when out of bed  Taken 03/24/2021 2035 by Marcelo Baldy, RN  Safety Promotion/Fall Prevention:   safety round/check completed   nonskid shoes/slippers when out of bed  Intervention: Prevent Skin Injury  Recent Flowsheet Documentation  Taken 03/24/2021 2035 by Marcelo Baldy, RN  Body Position: sitting  Skin Protection:   adhesive use limited   transparent dressing maintained   tubing/devices free from skin contact  Intervention: Prevent Infection  Recent Flowsheet Documentation  Taken 03/25/2021 0016 by Marcelo Baldy, RN  Infection Prevention:   personal protective equipment utilized   promote handwashing   rest/sleep promoted   single patient room provided  Taken 03/24/2021 2200 by Marcelo Baldy, RN  Infection Prevention:   personal protective equipment utilized   promote handwashing   rest/sleep promoted   single  patient room provided  Taken 03/24/2021 2035 by Marcelo Baldy, RN  Infection Prevention:   personal protective equipment utilized   promote handwashing   rest/sleep promoted   single patient room provided  Goal: Optimal Comfort and Wellbeing  Outcome: Ongoing (see interventions/notes)  Intervention: Provide Person-Centered Care  Recent Flowsheet Documentation  Taken 03/24/2021 2035 by Marcelo Baldy, RN  Trust Relationship/Rapport:   care explained   questions answered   questions encouraged   thoughts/feelings acknowledged  Goal: Rounds/Family Conference  Outcome: Ongoing (see interventions/notes)     Problem: Health Knowledge, Opportunity to Enhance (Adult,Obstetrics,Pediatric)  Goal: Knowledgeable about Health Subject/Topic  Description: Patient will demonstrate the desired outcomes by discharge/transition of care.  Outcome: Ongoing (see interventions/notes)  Intervention: Enhance Health Knowledge  Recent Flowsheet Documentation  Taken 03/24/2021 2035 by Marcelo Baldy, RN  Family/Support System Care: self-care encouraged  Supportive Measures: active listening utilized     Problem: Fall Injury Risk  Goal: Absence of Fall and Fall-Related Injury  Outcome: Ongoing (see interventions/notes)  Intervention: Identify and Manage Contributors  Recent Flowsheet Documentation  Taken 03/24/2021 2035 by Marcelo Baldy, RN  Self-Care Promotion: independence encouraged  Intervention: Promote Injury-Free Environment  Recent Flowsheet Documentation  Taken 03/25/2021 6270 by Marcelo Baldy, RN  Safety Promotion/Fall Prevention: safety round/check completed  Taken 03/25/2021 0236 by Marcelo Baldy, RN  Safety Promotion/Fall Prevention: safety round/check completed  Taken 03/25/2021 0016 by Marcelo Baldy, RN  Safety Promotion/Fall Prevention: safety round/check completed  Taken 03/24/2021 2200 by Marcelo Baldy, RN  Safety Promotion/Fall Prevention:   safety round/check completed   nonskid shoes/slippers when out of  bed  Taken 03/24/2021 2035 by Marcelo Baldy, RN  Safety Promotion/Fall Prevention:   safety round/check completed   nonskid shoes/slippers when out of bed  Patient had a fairly settled night. Still having frequent productive cough and is still on O2 therapy at 4LPM. No complaints of shortness of breath. No any other issues overnight,

## 2021-03-25 NOTE — Nurses Notes (Addendum)
[phone]: [phone] Agresti, patient stating no pain relief from PRN. Moaning in pain. States he is unable to work with PT due to pain. #[id number]    15:20 Paged service regarding access. Midline does not draw, STAT RN has to get lab work when available. They recommended based on ultrasound visual that patient have vascular access consult.

## 2021-03-25 NOTE — Care Management Notes (Signed)
[facility]  Care Management Note    Patient Name: Alexander Richard  Date of Birth: Dec 06, 1960  Sex: male  Date/Time of Admission: 03/22/2021  4:29 PM  Room/Bed: 970/A  Payor: [place] / Plan: Evette Doffing VACCN/OPTUM / Product Type: Managed Care /    LOS: 3 days   Primary Care Providers:  Morton Amy, MD, MD (General)    Admitting Diagnosis:  Sepsis (CMS [facility]) [A41.9]    Assessment:      03/25/21 1451   Assessment Details   Assessment Type Continued Assessment   Date of Care Management Update 03/25/21   Date of Next DCP Update 03/27/21   Care Management Plan   Discharge Planning Status plan in progress         Discharge Plan:  Home (Patient/Family Member/other) (code 1)    [facility] called  [facility] [place] [phone], inquired if patient has transportation benefits, [place] spoke to [place] per [place], patient does not have any transportation benefits.  [place] also spoke to [phone] respiratory dept, spoke to [place] regarding potential DME post hospitalization.  Per Corene Cornea with respiratory dept, to meet criteria RA O2 sat less than 88%, ABG or PO2 less than 55%.  [place] asked Corene Cornea regarding smokers with [facility] obtaining oxygen, per Corene Cornea, able to obtain oxygen;oever there are guidelines that must be met; patients educated and supplies with [employer]. D/C dispo home vs home with [facility]W, will continue to assess.    The patient will continue to be evaluated for developing discharge needs.     Case Manager: Kerry Dory, [place]  Phone: [phone]

## 2021-03-25 NOTE — Progress Notes (Signed)
RN called stabbing chest pain:     Ordered prn Morphine every 4 hours.     Patricia Pesa, MD

## 2021-03-25 NOTE — Care Plan (Signed)
03/25/21 0932   Therapist Pager   OT Assigned/ Pager # Nelli Swalley [phone]/jordyn [phone]   Rehab Session   Document Type rehab contact note   Total OT Minutes: 0   Daily Activity AM-PAC/6-clicks Score   Patient Mobility Barrier Patient declined treatment  (Increased pain - OT offered repositioning pt declined.)     Cain Saupe, OTS  Pager [phone]    I was present for the student's assessment/treatment of the patient. I agree with the findings and plan of care as documented in the student's note. Any exceptions/additions are edited. Thanks.  Bennetta Laos, OTR/L  Pager #: [phone]

## 2021-03-25 NOTE — Progress Notes (Addendum)
[facility]  Medicine Progress Note  Full Code    Alexander Richard  Date of service: 03/25/2021    Subjective:   Patient remains on 4L O2 via NC (no previous baseline), and notes feeling dyspneic with ambulation. He has mildly productive cough. Indicates that he was told he would likely have bronchoscopy / EBUS tomorrow. He notes persisting, inadequately controlled right flank pain. Denies changes in urination, hematuria, abdominal pain, chest pain.    Vital Signs:  Temp (24hrs) Max:36.7 C (98.1 F)      Systolic (24hrs), Avg:115 , Min:103 , Max:129     Diastolic (24hrs), Avg:80, Min:72, Max:87    Temp  Avg: 36.5 C (97.7 F)  Min: 36.4 C (97.5 F)  Max: 36.7 C (98.1 F)  MAP (Non-Invasive)  Avg: 90 mmHG  Min: 80 mmHG  Max: 96 mmHG  Pulse  Avg: 74.4  Min: 63  Max: 81  Resp  Avg: 18.3  Min: 14  Max: 22  SpO2  Avg: 95.5 %  Min: 94 %  Max: 97 %       I/O:  I/O last 24 hours:      Intake/Output Summary (Last 24 hours) at 03/25/2021 0704  Last data filed at 03/25/2021 0559  Gross per 24 hour   Intake 890 ml   Output --   Net 890 ml     I/O current shift:  No intake/output data recorded.    acetaminophen (TYLENOL) tablet, 650 mg, Oral, Q4H PRN  albuterol (PROVENTIL) 2.5 mg / 3 mL (0.083%) neb solution, 2.5 mg, Nebulization, Q4H PRN  arformoterol (BROVANA) 15 mcg/2 mL nebulizer solution, 15 mcg, Nebulization, 2x/day  D5W 250 mL flush bag, , Intravenous, Q15 Min PRN  DULoxetine (CYMBALTA) delayed release capsule, 60 mg, Oral, Daily  enoxaparin PF (LOVENOX) 40 mg/0.4 mL SubQ injection, 40 mg, Subcutaneous, Daily  gabapentin (NEURONTIN) capsule, 600 mg, Oral, 3x/day  magnesium sulfate 2 G in SW 50 mL premix IVPB, 2 g, Intravenous, Once  methocarbamol (ROBAXIN) tablet, 750 mg, Oral, 2x/day  nicotine (NICODERM CQ) transdermal patch (mg/24 hr), 21 mg, Transdermal, Daily  NS 250 mL flush bag, , Intravenous, Q15 Min PRN  NS flush syringe, 2-6 mL, Intracatheter, Q8HRS  NS flush syringe, 2-6 mL, Intracatheter, Q1 MIN  PRN  NS premix infusion, , Intravenous, Continuous  piperacillin-tazobactam (ZOSYN) 4.5 g in NS 100 mL IVPB, 4.5 g, Intravenous, Q8H  sennosides-docusate sodium (SENOKOT-S) 8.6-50mg  per tablet, 1 Tablet, Oral, 2x/day  tamsulosin (FLOMAX) capsule, 0.4 mg, Oral, Daily after Dinner  tiotropium bromide (SPIRIVA RESPIMAT) 2.5 mcg per inhalation oral inhaler - "Nursing to administer", 2 Puff, Inhalation, Daily        No Known Allergies    Physical Exam:  General: appears stated age, sitting in bed, no distress  Eyes: conjunctiva clear, pupils equal and round  HENT: head atraumatic and normocephalic, moist oral mucosea  Lungs: continued expiratory wheezes over right lung field, no appreciable crackles  Cardiovascular: regular rate and rhythm, no murmurs, no rubs, no gallops  Abdomen: soft, non-tender, non-distended, bowel sounds present  Extremities: no cyanosis, no LE edema  Skin: skin warm and dry, no rashes, multiple tattoos  Neurologic: alert, responding appropriately, no gross deficits    Labs:  I have reviewed all lab results.  Lab Results Today:    Results for orders placed or performed during the hospital encounter of 03/22/21 (from the past 24 hour(s))   BASIC METABOLIC PANEL   Result Value Ref Range  SODIUM 131 (L) 136 - 145 mmol/L    POTASSIUM 4.6 3.5 - 5.1 mmol/L    CHLORIDE 98 96 - 111 mmol/L    CO2 TOTAL 27 23 - 31 mmol/L    ANION GAP 6 4 - 13 mmol/L    CALCIUM 9.5 8.8 - 10.2 mg/dL    GLUCOSE 88 65 - 161 mg/dL    BUN 21 8 - 25 mg/dL    CREATININE 0.96 (L) 0.75 - 1.35 mg/dL    BUN/CREA RATIO 32 (H) 6 - 22    ESTIMATED GFR >90 >=60 mL/min/BSA   MAGNESIUM   Result Value Ref Range    MAGNESIUM 1.7 (L) 1.8 - 2.6 mg/dL   CBC WITH DIFF   Result Value Ref Range    WBC 18.8 (H) 3.7 - 11.0 x10^3/uL    RBC 4.41 (L) 4.50 - 6.10 x10^6/uL    HGB 11.8 (L) 13.4 - 17.5 g/dL    HCT 04.5 (L) 40.9 - 52.0 %    MCV 81.9 78.0 - 100.0 fL    MCH 26.8 26.0 - 32.0 pg    MCHC 32.7 31.0 - 35.5 g/dL    RDW-CV 81.1 (H) 91.4 - 15.5 %     PLATELETS 328 150 - 400 x10^3/uL    MPV 8.8 8.7 - 12.5 fL   MANUAL DIFF AND MORPHOLOGY-SYSMEX   Result Value Ref Range    NEUTROPHIL % 72 %    LYMPHOCYTE %  16 %    MONOCYTE % 7 %    EOSINOPHIL % 1 %    BASOPHIL % 0 %    NEUTROPHIL BANDS % 1 %    METAMYELOCYTE %  2 %    MYELOCYTE % 2 %    NEUTROPHIL # 13.72 (H) 1.50 - 7.70 x10^3/uL    LYMPHOCYTE # 3.01 1.00 - 4.80 x10^3/uL    MONOCYTE # 1.32 (H) 0.20 - 1.10 x10^3/uL    EOSINOPHIL # 0.19 <=0.50 x10^3/uL    BASOPHIL # <0.10 <=0.20 x10^3/uL    RBC MORPHOLOGY Normal RBC and PLT Morphology        Radiology:    I have reviewed imaging results  Results for orders placed or performed during the hospital encounter of 03/22/21 (from the past 72 hour(s))   XR AP MOBILE CHEST     Status: None    Narrative    XR AP MOBILE CHEST performed on 03/22/2021 5:04 PM    INDICATION: 61 years old Male; pneumonia    TECHNIQUE: 1 views of the chest; 1 images    COMPARISON: None available    FINDINGS:  The heart is normal in size. Abnormal airspace opacities in the right mid   and lower zone, concerning for multifocal consolidation in the current   clinical setting. No significant pleural effusion or pneumothorax.      Impression    Abnormal airspace opacities in the right mid and lower zone, concerning   for multifocal consolidation in the current clinical setting.     CT CHEST W IV CONTRAST     Status: None    Narrative    Alexander Richard    Male, 61 years old.    CT CHEST W IV CONTRAST performed on 03/23/2021 1:14 AM.    REASON FOR EXAM:  Worsening RML consolidation    RADIATION DOSE: 227.05 mGy.cm    CONTRAST: 80 ml's of Isovue 370    TECHNIQUE: Contrast-enhanced CT chest was performed. Multiplanar reformats provided.    COMPARISON:  None.    FINDINGS:     LUNGS: Hyperinflation and emphysematous changes are present. Masslike consolidation occupies the right lower lobe with confluent extension to the right middle lobe. Right lower lobe component abuts the pleura measures at least 7.9 x 7  x 5.5 cm. There is internal air noted which may reflect developing necrosis or abscess. There is opacification of the right middle lobe with volume loss.    PLEURA: Minimal right pleural thickening and/or reaction is noted. No drainable pleural collection is present.    MEDIASTINUM: Thyroid is not well assessed by CT imaging. There are a few borderline-enlarged mediastinal lymph nodes with enlarged right hilar lymph node evident. These may be reactive. Malignancy not excluded.    HEART AND GREAT VESSELS: No pericardial effusion. There is atherosclerotic disease and coronary vascular calcification present.    ABDOMEN: Limited assessment of the upper abdomen demonstrates partial visualization of the solid organs. Mild adrenal thickening noted.    OTHER: There is degenerative change of the spine included osseous structures. Exaggerated thoracic kyphosis is evident.      Impression    1. Regions of masslike airspace consolidation occupying the right middle and lower lobes. Regions of focal internal air suggest underlying necrosis or developing abscess which can be seen with infection or malignancy. Comparison to any recent previous imaging would be of benefit, otherwise short-term follow-up is recommended after a period of therapy. Background changes of hyperinflation and emphysematous change.  2. Minimal right-sided pleural thickening and/or fluid. Borderline to mildly enlarged mediastinal and right hilar lymph nodes may be reactive assessment on short-term follow-up is recommended.  3. Atherosclerotic disease and coronary vascular calcification.           PT/OT: No    Consults: pulmonary    Assessment/ Plan:   Active Hospital Problems    Diagnosis   . Primary Problem: Pneumonia   . 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 Lung Mass vs Pneumonia (cannot rule-out gram  negative) vs Abscess  Sepsis  COPD  -SIRS improved, WBC elevation down-trending (18.8k this AM)  - on 4L O2 via NC this AM, wean further as tolerated and will need oxygen challenge on discharge (no previous need)  - CT imaging with consolidation and cavitation at RML and towards lower lose concerning for necrotizing infection  - MRSA negative, sputum culture with oral flora, strep & legionella Ag negative, Biofire viral assay negative; check PCT  - blood cultures (3/20) [id number]  - pulmonary consult, appreciate recs, plan for bronchoscopy / EBUS on 3/23 or 3/24  - Duoneb QID and albuterol nebs PRN  - continue Brovana nebulizer BID and Spiriva daily (added this admission)  - continue IV Zosyn for now (start: 3/20)  - aggressive pulmonary toiletry, mobilization, P&V, incentive spirometry  - Tylenol, Tramadol, lidopatch for pain relief    Hyponatremia, mild  - Na 131 this AM  - blood osm 289 (WNL) so unclear if true hyponatremia or mild SIADH (related to lung pathology)  - consider rechecking urine and serum osm and Na if down-trending on subsequent BMP    Nicotine Dependence  - (3/20) counseled 7 minutes on smoking cessation  - continue NRT with 21 mg transdermal patch daily    Chronic Pain  Hx of Spinal Stenosis  - continue Neurontin 600  mg TID  - continue Robaxin 750 mg (increased to QID)  - continue Cymbalta 60 mg daily    DVT/PE Prophylaxis: Enoxaparin  Disposition Planning: Home discharge      Asencion Noble, MD 07:04 03/25/2021  Hospitalist  [facility]    On 03/25/2021 I spent a total visit time of 52 minutes. Time included review of tests and ordering tests, obtaining/reviewing history, examining the patient, communicating with consultants, documenting clinical information and counseling the patient and/or family regarding the diagnosis and management plan and coordination of care involved services directly related to patient care.    FOLLOW UP NOTE LEVEL 3 (TOTAL TIME > 50 MINUTES) ([id number])

## 2021-03-26 ENCOUNTER — Encounter (HOSPITAL_COMMUNITY): Payer: Self-pay

## 2021-03-26 DIAGNOSIS — F17210 Nicotine dependence, cigarettes, uncomplicated: Secondary | ICD-10-CM

## 2021-03-26 LAB — CBC WITH DIFF
HCT: 36.9 % — ABNORMAL LOW (ref 38.9–52.0)
HGB: 12.1 g/dL — ABNORMAL LOW (ref 13.4–17.5)
MCH: 26.5 pg (ref 26.0–32.0)
MCHC: 32.8 g/dL (ref 31.0–35.5)
MCV: 80.7 fL (ref 78.0–100.0)
MPV: 8.5 fL — ABNORMAL LOW (ref 8.7–12.5)
PLATELETS: 309 10*3/uL (ref 150–400)
RBC: 4.57 10*6/uL (ref 4.50–6.10)
RDW-CV: 15.9 % — ABNORMAL HIGH (ref 11.5–15.5)
WBC: 19.5 10*3/uL — ABNORMAL HIGH (ref 3.7–11.0)

## 2021-03-26 LAB — MANUAL DIFF AND MORPHOLOGY-SYSMEX
BASOPHIL #: <0.1 10*3/uL (ref <=0.20)
BASOPHIL %: 0 %
EOSINOPHIL #: 0.2 10*3/uL (ref <=0.50)
EOSINOPHIL %: 1 %
LYMPHOCYTE #: 1.37 10*3/uL (ref 1.00–4.80)
LYMPHOCYTE %: 7 %
MONOCYTE #: 1.17 10*3/uL — ABNORMAL HIGH (ref 0.20–1.10)
MONOCYTE %: 6 %
MYELOCYTE %: 1 %
NEUTROPHIL #: 16.58 10*3/uL — ABNORMAL HIGH (ref 1.50–7.70)
NEUTROPHIL %: 85 %

## 2021-03-26 LAB — BASIC METABOLIC PANEL
ANION GAP: 10 mmol/L (ref 4–13)
BUN/CREA RATIO: 21 (ref 6–22)
BUN: 12 mg/dL (ref 8–25)
CALCIUM: 9.5 mg/dL (ref 8.8–10.2)
CHLORIDE: 100 mmol/L (ref 96–111)
CO2 TOTAL: 25 mmol/L (ref 23–31)
CREATININE: 0.58 mg/dL — ABNORMAL LOW (ref 0.75–1.35)
ESTIMATED GFR: >90 mL/min/BSA (ref >=60)
GLUCOSE: 84 mg/dL (ref 65–125)
POTASSIUM: 4.2 mmol/L (ref 3.5–5.1)
SODIUM: 135 mmol/L — ABNORMAL LOW (ref 136–145)

## 2021-03-26 LAB — MAGNESIUM: MAGNESIUM: 1.9 mg/dL (ref 1.8–2.6)

## 2021-03-26 MED ORDER — ARFORMOTEROL 15 MCG/2 ML SOLUTION FOR NEBULIZATION
INHALATION_SOLUTION | RESPIRATORY_TRACT | Status: AC
Start: 2021-03-26 — End: 2021-03-26
  Filled 2021-03-26: qty 2

## 2021-03-26 MED ORDER — ARFORMOTEROL 15 MCG/2 ML SOLUTION FOR NEBULIZATION
INHALATION_SOLUTION | RESPIRATORY_TRACT | Status: AC
Start: 2021-03-26 — End: 2021-03-26
  Filled 2021-03-26: qty 4

## 2021-03-26 MED ORDER — ONDANSETRON HCL (PF) 4 MG/2 ML INJECTION SOLUTION
4.0000 mg | Freq: Three times a day (TID) | INTRAMUSCULAR | Status: DC | PRN
Start: 2021-03-26 — End: 2021-04-02
  Administered 2021-03-26 – 2021-03-31 (×5): 4 mg via INTRAVENOUS
  Filled 2021-03-26 (×5): qty 2

## 2021-03-26 NOTE — Care Plan (Signed)
Received patient resting in bed with complains stabbing pain in his right side of his chest below the rib cage which is worst when he coughs. Patient is alert and oriented, up ad lib. Patient observed to have dyspnea on exertion and movement. Patient is on 2 liters of oxygen and does not wear oxygen at home. Plan for bronchoscopy on 03/27/2021. Plan of care reviewed with the patient. Pain managed with his scheduled and prn pain regimen. Patient denies any other concern at this time. Moderate fall precaution maintained. Call light within reach. Will continue to monitor and assess patient.     Problem: Adult Inpatient Plan of Care  Goal: Plan of Care Review  Outcome: Ongoing (see interventions/notes)  Goal: Patient-Specific Goal (Individualized)  Outcome: Ongoing (see interventions/notes)  Goal: Absence of Hospital-Acquired Illness or Injury  Outcome: Ongoing (see interventions/notes)  Intervention: Identify and Manage Fall Risk  Recent Flowsheet Documentation  Taken 03/26/2021 2328 by Levin Bacon, RN  Safety Promotion/Fall Prevention: safety round/check completed  Taken 03/26/2021 2044 by Levin Bacon, RN  Safety Promotion/Fall Prevention:   motion sensor pad activated   nonskid shoes/slippers when out of bed   muscle strengthening facilitated   safety round/check completed  Intervention: Prevent Skin Injury  Recent Flowsheet Documentation  Taken 03/26/2021 2044 by Levin Bacon, RN  Body Position: semi-fowlers (30-45 degrees)  Skin Protection:   adhesive use limited   transparent dressing maintained   tubing/devices free from skin contact  Intervention: Prevent and Manage VTE (Venous Thromboembolism) Risk  Recent Flowsheet Documentation  Taken 03/26/2021 2044 by Levin Bacon, RN  VTE Prevention/Management: ambulation promoted  Intervention: Prevent Infection  Recent Flowsheet Documentation  Taken 03/26/2021 2044 by Levin Bacon, RN  Infection Prevention:   rest/sleep promoted   single patient room provided    barrier precautions utilized  Goal: Optimal Comfort and Wellbeing  Outcome: Ongoing (see interventions/notes)  Intervention: Provide Person-Centered Care  Recent Flowsheet Documentation  Taken 03/26/2021 2044 by Levin Bacon, RN  Trust Relationship/Rapport:   care explained   choices provided   thoughts/feelings acknowledged     Problem: Pain Acute  Goal: Optimal Pain Control and Function  Outcome: Ongoing (see interventions/notes)  Intervention: Optimize Psychosocial Wellbeing  Recent Flowsheet Documentation  Taken 03/26/2021 2044 by Levin Bacon, RN  Diversional Activities:   smartphone   television     Problem: Gas Exchange Impaired  Goal: Optimal Gas Exchange  Outcome: Ongoing (see interventions/notes)  Intervention: Optimize Oxygenation and Ventilation  Recent Flowsheet Documentation  Taken 03/26/2021 2044 by Levin Bacon, RN  Head of Bed [facility]) Positioning: HOB at 30-45 degrees

## 2021-03-26 NOTE — Care Plan (Signed)
Pt has remained in stable condition throughout shift, VS stable. Pt complaining of pain, PRN pain medication administered. Pt denies any other needs at this time, resting in bed. Will continue to monitor.   Problem: Adult Inpatient Plan of Care  Goal: Plan of Care Review  Outcome: Ongoing (see interventions/notes)  Goal: Patient-Specific Goal (Individualized)  Outcome: Ongoing (see interventions/notes)  Goal: Absence of Hospital-Acquired Illness or Injury  Outcome: Ongoing (see interventions/notes)  Intervention: Identify and Manage Fall Risk  Recent Flowsheet Documentation  Taken 03/26/2021 1600 by Rolly Salter, RN  Safety Promotion/Fall Prevention:   activity supervised   fall prevention program maintained   nonskid shoes/slippers when out of bed   safety round/check completed  Taken 03/26/2021 1200 by Rolly Salter, RN  Safety Promotion/Fall Prevention:   activity supervised   fall prevention program maintained   nonskid shoes/slippers when out of bed   safety round/check completed  Taken 03/26/2021 1000 by Rolly Salter, RN  Safety Promotion/Fall Prevention:   activity supervised   fall prevention program maintained   nonskid shoes/slippers when out of bed   safety round/check completed  Taken 03/26/2021 0800 by Rolly Salter, RN  Safety Promotion/Fall Prevention:   activity supervised   fall prevention program maintained   nonskid shoes/slippers when out of bed   safety round/check completed  Intervention: Prevent Skin Injury  Recent Flowsheet Documentation  Taken 03/26/2021 0800 by Rolly Salter, RN  Body Position: positioned/repositioned independently  Skin Protection:   adhesive use limited   incontinence pads utilized  Intervention: Prevent and Manage VTE (Venous Thromboembolism) Risk  Recent Flowsheet Documentation  Taken 03/26/2021 0800 by Rolly Salter, RN  VTE Prevention/Management:   ambulation promoted   anticoagulant therapy maintained  Goal: Optimal Comfort and Wellbeing  Outcome: Ongoing (see  interventions/notes)  Goal: Rounds/Family Conference  Outcome: Ongoing (see interventions/notes)     Problem: Health Knowledge, Opportunity to Enhance (Adult,Obstetrics,Pediatric)  Goal: Knowledgeable about Health Subject/Topic  Description: Patient will demonstrate the desired outcomes by discharge/transition of care.  Outcome: Ongoing (see interventions/notes)     Problem: Fall Injury Risk  Goal: Absence of Fall and Fall-Related Injury  Outcome: Ongoing (see interventions/notes)  Intervention: Promote Scientist, clinical (histocompatibility and immunogenetics) Documentation  Taken 03/26/2021 1600 by Rolly Salter, RN  Safety Promotion/Fall Prevention:   activity supervised   fall prevention program maintained   nonskid shoes/slippers when out of bed   safety round/check completed  Taken 03/26/2021 1200 by Rolly Salter, RN  Safety Promotion/Fall Prevention:   activity supervised   fall prevention program maintained   nonskid shoes/slippers when out of bed   safety round/check completed  Taken 03/26/2021 1000 by Rolly Salter, RN  Safety Promotion/Fall Prevention:   activity supervised   fall prevention program maintained   nonskid shoes/slippers when out of bed   safety round/check completed  Taken 03/26/2021 0800 by Rolly Salter, RN  Safety Promotion/Fall Prevention:   activity supervised   fall prevention program maintained   nonskid shoes/slippers when out of bed   safety round/check completed

## 2021-03-26 NOTE — Care Plan (Signed)
[facility]  Occupational Therapy Initial Evaluation    Patient Name: Alexander Richard  Date of Birth: Apr 29, 1960  Height: Height: 180.3 cm ('5\' 11"' )  Weight: Weight: 98.7 kg (217 lb 9.6 oz)  Room/Bed: 970/A  Payor: [facility] [employer] / Plan: [place] [employer]AC[employer]/OPTUM / Product Type: Managed Care /     Assessment:   Mr. Kilmer responded well to OT session. patient demonstrates increased pain and SOB with OOB activity and R flank pain limiting performance. He should be able to return home functionally but will progress well with continued skilled OT intervention in this setting in prep for d/c to address functional deficits.      Discharge Needs:   Equipment Recommendation: shower chair    The patient presents with mobility limitations due to impaired balance, impaired strength, and impaired functional activity tolerance that significantly impair/prevent patient's ability to participate in mobility-related activities of daily living (MRADLs) including  bathing. This functional mobility deficit can be sufficiently resolved with the use of a shower chair in order to decrease the risk of falls, morbidity, and mortality in performance of these MRADLs.  Patient is able to safely use this assistive device.    Discharge Disposition: home with assist, home with home health    JUSTIFICATION OF DISCHARGE RECOMMENDATION   Based on current diagnosis, functional performance prior to admission, and current functional performance, this patient requires continued OT services in home with assist, home with home health  in order to achieve significant functional improvements.    Plan:   Current Intervention: ADL retraining, IADL retraining, bed mobility training, balance training, endurance training, therapeutic exercise, transfer training    To provide Occupational therapy services minimum of 1x/week, until discharge.       The risks/benefits of therapy have been discussed with the patient/caregiver  and he/she is in agreement with the established plan of care.       Subjective & Objective        03/26/21 1133   Therapist Pager   OT Assigned/ Pager # Shiree Altemus [phone]   Rehab Session   Document Type evaluation   Total OT Minutes: 13   Patient Effort good   Symptoms Noted During/After Treatment increased pain;shortness of breath   General Information   Patient Profile Reviewed yes   General Observations of Patient Patient sleeping in bed. Agreeable to OT session.   Pertinent History of Current Functional Problem 61 y.o male with PMH of spinal stenosis, GERD, recurrent PNA vs cavitary mass who presented to MICU via [facility] with hemoptysis, fever, and dyspnea   Medical Lines PIV Line;Telemetry   Respiratory Status nasal cannula   Existing Precautions/Restrictions fall precautions;full code   Pre Treatment Status   Pre Treatment Patient Status Patient supine in bed;Call light within reach;Telephone within reach;Nurse approved session   Support Present Pre Treatment  None   Communication Pre Treatment  Nurse   Mutuality/Individual Preferences   Individualized Care Needs OOB with SBA, encourage chair for meals and walking in hallway >3x/day   Plan of Care Reviewed With patient   Living Environment   Lives With spouse   Living Arrangements house   Home Accessibility stairs within home;bed and bath on same level   [place] [address] with railing, lives on one level once inside; wife also has health problems and has limited ability to assist.   Functional Level Prior   Ambulation 0 - independent   Transferring 0 - independent  Toileting 0 - independent   Bathing 0 - independent   Dressing 0 - independent   Eating 0 - independent   Communication 0 - understands/communicates without difficulty   Prior Functional Level Comment independent at baseline with community ambulation without SOB; independent with ADL management   Self-Care   Usual Activity Tolerance good   Current Activity Tolerance fair   Equipment  Currently Used at Home no   Vital Signs   Vitals Comment mild to moderate SOB noted during OOB activity on 2L NC, edu on pursed lip breathing   Pain Assessment   Pre/Posttreatment Pain Comment R flank; activity completed within patient's tolerance   Pretreatment Pain Rating 5/10   Posttreatment Pain Rating 5/10   Coping/Psychosocial   Observed Emotional State calm;cooperative   Verbalized Emotional State acceptance   Coping/Psychosocial Response Interventions   Plan Of Care Reviewed With patient   Cognitive Assessment/Interventions   Behavior/Mood Observations behavior appropriate to situation, WNL/WFL;alert;cooperative   Orientation Status oriented x 4   Attention WNL/WFL   Follows Commands WNL   Vision Assessment/Interventions   Visual Impairment/Limitations WFL   RUE Assessment   RUE Assessment WFL- Within Functional Limits   LUE Assessment   LUE Assessment WFL- Within Functional Limits   RLE Assessment   RLE Assessment WFL- Within Functional Limits   LLE Assessment   LLE Assessment WFL- Within Functional Limits   Trunk Assessment   Trunk Assessment X-Exceptions   Trunk ROM limited by pain   Grip Strength   Grip Left (5/5) normal, left   Right Grip (5/5) normal, right   Mobility Assessment/Training   Mobility Comment Patient performs bed mobility with SBA with HOB elevated to ~25 degrees; sit to stand/stand to sit from EOB and chair with SBA; functional mobility ~250' using no A/D with SBA for balance and requiring one standing rest break 2/2 moderate shortness of breath. Edu on pursed lip breathing technique and pacing techniques.   ADL Assessment/Intervention   ADL Comments Patient performs LB dressing to donn slides with set up A; pants and socks already in place, patient edu on figure four positioning technique for LB dressing/bathing management; Patient demos ability to complete UE ADL management with set up A or better depending on medical line management.   Balance Skill Training   Comment no DME in  standing   Sitting Balance: Static good balance   Sitting, Dynamic (Balance) fair + balance   Sit-to-Stand Balance fair balance   Standing Balance: Static fair balance   Standing Balance: Dynamic fair - balance   Post Treatment Status   Post Treatment Patient Status Patient sitting in bedside chair or w/c;Call light within reach;Telephone within reach   Support Present Post Treatment  None   Communication [place] Nurse   Care Plan Goals   OT Rehab Goals Occupational Therapy Goal;Occupational Therapy Goal 2   Occupational Therapy Goals   OT Goal, Date Established 03/26/21   OT Goal, Time to Achieve by discharge   OT Goal, Activity Type Patient will perform full ADL routine using AE as needed   OT Goal, Independence Level independent   Occupational Therapy Goal 2   OT Goal, Date Established 03/26/21   OT Goal, Time to Achieve by discharge   OT Goal, Activity Type Patient will perform standing tasks >8 minutes to increase grooming/IADL performance   OT Goal, Independence Level independent   Planned Therapy Interventions, OT Eval   Planned Therapy Interventions ADL retraining;IADL retraining;bed mobility training;balance training;endurance training;therapeutic exercise;transfer  training   Functional Impairment   Overall Functional Impairments/Problem List balance impaired;endurance;strength decreased   Clinical Impression   Functional Level at Time of Session Mr. Wiltsey responded well to OT session. patient demonstrates increased pain and SOB with OOB activity and R flank pain limiting performance. He should be able to return home functionally but will progress well with continued skilled OT intervention in this setting in prep for d/c to address functional deficits.   Patient/Family Goals Statement to go home   Criteria for Skilled Therapeutic Interventions Met (OT) skilled treatment is necessary   Rehab Potential good   Therapy Frequency minimum of 1x/week   Predicted Duration of Therapy until discharge    Anticipated Equipment Needs at Discharge shower chair   Anticipated Discharge Disposition home with assist;home with home health   Evaluation Complexity Justification   Occupational Profile Review Brief history   Performance Deficits 1-3 deficits   Clinical Decision Making Low analytic complexity   Evaluation Complexity Low     OT Education: Patient/family educated on Role of OT at [facility], energy conservation strategies during ADL, falls prevention, home safety strategies, importance of participating in OOB activity and ADL routine during hospital stay.  Patient/family verbalized/demonstrated initial understanding, would benefit from reinforcement to ensure carry over.  Therapist:   Carney Corners, OT   Pager #: [phone]

## 2021-03-26 NOTE — Progress Notes (Signed)
[facility]  Medicine Progress Note    Alexander Richard  Date of service: 03/26/2021  Date of Admission:  03/22/2021    Hospital Day:  LOS: 4 days   Subjective: Patient was seen and examined at bedside. There were no new issues overnight. Denies fevers, chills, CP, SOB, abd pain, and NVD.      Vital Signs:  Temp  Avg: 36.7 C (98.1 F)  Min: 36.6 C (97.9 F)  Max: 36.9 C (98.4 F)    Pulse  Avg: 89.3  Min: 82  Max: 100 BP  Min: 115/77  Max: 139/88   Resp  Avg: 17.9  Min: 17  Max: 18 SpO2  Avg: 92.4 %  Min: 85 %  Max: 97 %          Input/Output    Intake/Output Summary (Last 24 hours) at 03/26/2021 1851  Last data filed at 03/26/2021 1546  Gross per 24 hour   Intake 1810 ml   Output --   Net 1810 ml    I/O last shift:  03/23 0700 - 03/23 1859  In: 450 [P.O.:350]  Out: -    acetaminophen (TYLENOL) tablet, 650 mg, Oral, Q4H PRN  albuterol (PROVENTIL) 2.5mg / 0.5 mL nebulizer solution, 2.5 mg, Nebulization, Q4H PRN  arformoterol (BROVANA) 15 mcg/2 mL nebulizer solution, 15 mcg, Nebulization, 2x/day  D5W 250 mL flush bag, , Intravenous, Q15 Min PRN  DULoxetine (CYMBALTA) delayed release capsule, 60 mg, Oral, Daily  enoxaparin PF (LOVENOX) 40 mg/0.4 mL SubQ injection, 40 mg, Subcutaneous, Daily  gabapentin (NEURONTIN) capsule, 600 mg, Oral, 3x/day  lidocaine-menthol (LIDOPATCH) 3.6%-1.25% patch, 1 Patch, Transdermal, Daily  methocarbamol (ROBAXIN) tablet, 750 mg, Oral, 4x/day  morphine 2 mg/mL injection, 2 mg, Intravenous, Q4H PRN  nicotine (NICODERM CQ) transdermal patch (mg/24 hr), 21 mg, Transdermal, Daily  NS 250 mL flush bag, , Intravenous, Q15 Min PRN  NS flush syringe, 2-6 mL, Intracatheter, Q8HRS  NS flush syringe, 2-6 mL, Intracatheter, Q1 MIN PRN  NS premix infusion, , Intravenous, Continuous  ondansetron (ZOFRAN) 2 mg/mL injection, 4 mg, Intravenous, Q8H PRN  piperacillin-tazobactam (ZOSYN) 4.5 g in NS 100 mL IVPB, 4.5 g, Intravenous, Q8H  sennosides-docusate sodium (SENOKOT-S) 8.6-50mg  per tablet, 1  Tablet, Oral, 2x/day  tamsulosin (FLOMAX) capsule, 0.4 mg, Oral, Daily after Dinner  tiotropium bromide (SPIRIVA RESPIMAT) 2.5 mcg per inhalation oral inhaler - "Nursing to administer", 2 Puff, Inhalation, Daily  traMADol (ULTRAM) tablet, 50 mg, Oral, Q6H PRN        Physical Exam:  GENERAL: Pleasant, No acute distress  HEENT: Normocephalic, atraumatic, EOMI  NECK: Supple, trachea midline  PULM: CTA B/L, no wheezes, rales, or rhonchi  CV: RRR, systolic murmur  GI: abd soft, nontender, nondistended, normoactive BS  EXTREMITIES: no peripheral edema, pulses intact  NEURO: Cranial nerves 2-12 grossly intact  PSYCH: Alert and oriented x 3      Labs:  CBC Results Differential Results   Recent Results (from the past 30 hour(s))   CBC WITH DIFF    Collection Time: 03/26/21  4:58 AM   Result Value    WBC 19.5 (H)    HGB 12.1 (L)    HCT 36.9 (L)    PLATELETS 309    Recent Results (from the past 30 hour(s))   MANUAL DIFF AND MORPHOLOGY-SYSMEX    Collection Time: 03/26/21  4:58 AM   Result Value    NEUTROPHIL % 85    LYMPHOCYTE %  7    MONOCYTE %  6    EOSINOPHIL % 1    BASOPHIL % 0    MYELOCYTE % 1    BASOPHIL # <0.10   CBC WITH DIFF    Collection Time: 03/26/21  4:58 AM   Result Value    WBC 19.5 (H)      BMP Results Other Chemistries Results   Results for orders placed or performed during the hospital encounter of 03/22/21 (from the past 30 hour(s))   BASIC METABOLIC PANEL    Collection Time: 03/26/21  4:58 AM   Result Value    SODIUM 135 (L)    POTASSIUM 4.2    CHLORIDE 100    CO2 TOTAL 25    GLUCOSE 84    BUN 12    CREATININE 0.58 (L)   SODIUM    Collection Time: 03/25/21  1:44 PM   Result Value    SODIUM 131 (L)    Recent Results (from the past 30 hour(s))   MAGNESIUM    Collection Time: 03/26/21  4:58 AM   Result Value    MAGNESIUM 1.9      Liver/Pancreas Enzyme Results Liver Function Results   No results found for this or any previous visit (from the past 30 hour(s)). No results found for this or any previous visit (from  the past 30 hour(s)).   Cardiac Results Coags Results   No results found for this or any previous visit (from the past 30 hour(s)). No results found for this or any previous visit (from the past 30 hour(s)).     Radiology:     No new imaging    PT/OT: Yes    Consults: Pulm      Assessment/ Plan:   Active Hospital Problems    Diagnosis   . Primary Problem: Pneumonia   . Sepsis (CMS HCC)     61 y.o male with PMH of spinal stenosis, GERD, recurrent PNA vs cavitary mass who [facility]U [facility] with hemoptysis, fever, and dyspnea    Cavitary R. Lung Mass vs Pneumonia (cannot rule-out gram negative) vs Abscess  Sepsis (resolved)  COPD exacerbations  - Weaned to 2L O2 via NC this AM. Does not wear O2 at home  - CT imaging with consolidation and cavitation at RML and towards lower lose concerning for necrotizing infection  - MRSA negative, sputum culture with oral flora, strep & legionella Ag negative, Biofire viral assay negative; check PCT  - blood cultures (3/20) [id number]  - pulmonary consult, appreciate recs, plan for bronchoscopy / EBUS on 3/24  - Duoneb QID and albuterol nebs PRN  - continue Brovana nebulizer BID and Spiriva daily (added this admission)  - continue IV Zosyn for now (start: 3/20)  - aggressive pulmonary toiletry, mobilization, P&V, incentive spirometry  - Tylenol, Tramadol, lidopatch for pain relief    Hyponatremia, mild  - resolved  - blood osm 289 (WNL) so unclear if true hyponatremia or mild SIADH (related to lung pathology)    Nicotine Dependence  - (3/20) counseled 7 minutes on smoking cessation by previous attending  - continue NRT with 21 mg transdermal patch daily    Chronic Pain  Hx of Spinal Stenosis  - continue Neurontin 600 mg TID  - continue Robaxin 750 mg (increased to QID)  - continue Cymbalta 60 mg daily    DVT/PE Prophylaxis: Enoxaparin    Disposition Planning: Home discharge      On the day of the encounter, a total of  38  minutes was spent on this patient encounter  including review of historical information, education of current ailment to patient and/or family members, examination, documentation, and post-visit activities.     Diana Eves, DO  Hospitalist  03/26/2021

## 2021-03-26 NOTE — Nurses Notes (Signed)
Vascular Access Consult:    Consult received for line placement. Patient not on any meds that warrant central access and currently has midline from outside facility in place. Per RN, the line flushes without difficulty but currently has no blood return. RN from 3/22 stated STAT team assessed line and reported that pt has poor vasculature, likely contributing to lack of blood return in line. Unlike that placement of new midline would warrant different outcome.    As of today, RN reports line still functioning for med admin. Will discontinue consult at this time. Please place new order if vascular access becomes issue.    Lynetta Mare, RN  03/26/2021, 08:47  Vascular Access Team   Pager [phone]

## 2021-03-27 ENCOUNTER — Inpatient Hospital Stay (HOSPITAL_COMMUNITY): Payer: 59

## 2021-03-27 ENCOUNTER — Inpatient Hospital Stay (HOSPITAL_COMMUNITY): Payer: 59 | Admitting: Anesthesiology

## 2021-03-27 ENCOUNTER — Encounter (HOSPITAL_COMMUNITY)
Admission: AD | Discharge status: Against Medical Advice | Payer: Self-pay | Source: Other Acute Inpatient Hospital | Attending: Internal Medicine

## 2021-03-27 DIAGNOSIS — J9601 Acute respiratory failure with hypoxia: Secondary | ICD-10-CM

## 2021-03-27 DIAGNOSIS — R590 Localized enlarged lymph nodes: Secondary | ICD-10-CM

## 2021-03-27 DIAGNOSIS — R918 Other nonspecific abnormal finding of lung field: Secondary | ICD-10-CM

## 2021-03-27 LAB — CBC
HCT: 31.9 % — ABNORMAL LOW (ref 38.9–52.0)
HGB: 10.1 g/dL — ABNORMAL LOW (ref 13.4–17.5)
MCH: 26.1 pg (ref 26.0–32.0)
MCHC: 31.7 g/dL (ref 31.0–35.5)
MCV: 82.4 fL (ref 78.0–100.0)
MPV: 8.7 fL (ref 8.7–12.5)
PLATELETS: 287 10*3/uL (ref 150–400)
RBC: 3.87 10*6/uL — ABNORMAL LOW (ref 4.50–6.10)
RDW-CV: 15.9 % — ABNORMAL HIGH (ref 11.5–15.5)
WBC: 15.7 10*3/uL — ABNORMAL HIGH (ref 3.7–11.0)

## 2021-03-27 LAB — BAL FLUID COUNT
Nucleated Cell Count: 32000 /uL
Nucleated Cell Count: 7050 /uL
RBC COUNT: 1700 /uL
RBC COUNT: 6750 /uL

## 2021-03-27 LAB — TISSUE CULTURE (AEROBIC CULT & GRAM STAIN)

## 2021-03-27 LAB — BAL FLUID MANUAL DIFFERENTIAL
BASOPHIL %: 1 %
BRONCHIAL EPITHELIAL CELL %: 2 %
LYMPHOCYTE %: 1 %
LYMPHOCYTE %: 2 %
MONOCYTE/MACROPHAGE %: 8 %
MONOCYTE/MACROPHAGE %: 8 %
NEUTROPHIL %: 87 %
NEUTROPHIL %: 91 %

## 2021-03-27 LAB — BASIC METABOLIC PANEL
ANION GAP: 7 mmol/L (ref 4–13)
BUN/CREA RATIO: 14 (ref 6–22)
BUN: 8 mg/dL (ref 8–25)
CALCIUM: 8.8 mg/dL (ref 8.8–10.2)
CHLORIDE: 101 mmol/L (ref 96–111)
CO2 TOTAL: 24 mmol/L (ref 23–31)
CREATININE: 0.56 mg/dL — ABNORMAL LOW (ref 0.75–1.35)
ESTIMATED GFR: >90 mL/min/BSA (ref >=60)
GLUCOSE: 90 mg/dL (ref 65–125)
POTASSIUM: 3.8 mmol/L (ref 3.5–5.1)
SODIUM: 132 mmol/L — ABNORMAL LOW (ref 136–145)

## 2021-03-27 LAB — KOH PREP: KOH PREP: NONE SEEN

## 2021-03-27 LAB — PT/INR
INR: 1.43 — ABNORMAL HIGH (ref 0.80–1.20)
PROTHROMBIN TIME: 16.6 seconds — ABNORMAL HIGH (ref 9.1–13.9)

## 2021-03-27 LAB — MAGNESIUM: MAGNESIUM: 1.7 mg/dL — ABNORMAL LOW (ref 1.8–2.6)

## 2021-03-27 SURGERY — ELECTRO MAGNETIC NAVIGATION BRONCHOSCOPY
Anesthesia: General | Site: Bronchus

## 2021-03-27 MED ORDER — SODIUM CHLORIDE 0.9% FLUSH BAG - 250 ML
INTRAVENOUS | Status: DC | PRN
Start: 2021-03-27 — End: 2021-04-02
  Administered 2021-04-02: 20 mL via INTRAVENOUS

## 2021-03-27 MED ORDER — SODIUM CHLORIDE 0.9 % INTRAVENOUS SOLUTION
INTRAVENOUS | Status: DC | PRN
Start: 2021-03-27 — End: 2021-03-27
  Administered 2021-03-27: 0 via INTRAVENOUS

## 2021-03-27 MED ORDER — MIDAZOLAM 1 MG/ML INJECTION SOLUTION
INTRAMUSCULAR | Status: AC
Start: 2021-03-27 — End: 2021-03-27
  Filled 2021-03-27: qty 2

## 2021-03-27 MED ORDER — PROCHLORPERAZINE EDISYLATE 10 MG/2 ML (5 MG/ML) INJECTION SOLUTION
5.0000 mg | Freq: Once | INTRAMUSCULAR | Status: DC | PRN
Start: 2021-03-27 — End: 2021-03-28

## 2021-03-27 MED ORDER — DEXAMETHASONE SODIUM PHOSPHATE 4 MG/ML INJECTION SOLUTION
INTRAMUSCULAR | Status: AC
Start: 2021-03-27 — End: 2021-03-27
  Filled 2021-03-27: qty 1

## 2021-03-27 MED ORDER — PROPOFOL 10 MG/ML INTRAVENOUS EMULSION
INTRAVENOUS | Status: AC
Start: 2021-03-27 — End: 2021-03-27
  Filled 2021-03-27: qty 20

## 2021-03-27 MED ORDER — DEXAMETHASONE SODIUM PHOSPHATE (PF) 10 MG/ML INJECTION SOLUTION
4.0000 mg | Freq: Once | INTRAMUSCULAR | Status: DC | PRN
Start: 2021-03-27 — End: 2021-03-28

## 2021-03-27 MED ORDER — PROPOFOL 10 MG/ML INTRAVENOUS EMULSION
INTRAVENOUS | Status: AC
Start: 2021-03-27 — End: 2021-03-27
  Filled 2021-03-27: qty 50

## 2021-03-27 MED ORDER — EPHEDRINE SULFATE 50 MG/ML INTRAVENOUS SOLUTION
Freq: Once | INTRAVENOUS | Status: DC | PRN
Start: 2021-03-27 — End: 2021-03-27
  Administered 2021-03-27: 5 mg via INTRAVENOUS
  Administered 2021-03-27 (×2): 10 mg via INTRAVENOUS

## 2021-03-27 MED ORDER — ALBUTEROL SULFATE CONCENTRATE 2.5 MG/0.5 ML SOLUTION FOR NEBULIZATION
2.5000 mg | INHALATION_SOLUTION | Freq: Once | RESPIRATORY_TRACT | Status: DC | PRN
Start: 2021-03-27 — End: 2021-04-02
  Administered 2021-03-27: 2.5 mg via RESPIRATORY_TRACT
  Filled 2021-03-27: qty 1

## 2021-03-27 MED ORDER — IPRATROPIUM 0.5 MG-ALBUTEROL 3 MG (2.5 MG BASE)/3 ML NEBULIZATION SOLN
3.0000 mL | INHALATION_SOLUTION | Freq: Once | RESPIRATORY_TRACT | Status: DC | PRN
Start: 2021-03-27 — End: 2021-04-02
  Administered 2021-03-27 (×2): 3 mL via RESPIRATORY_TRACT
  Filled 2021-03-27: qty 3

## 2021-03-27 MED ORDER — GLYCOPYRROLATE 0.2 MG/ML INJECTION SOLUTION
INTRAMUSCULAR | Status: AC
Start: 2021-03-27 — End: 2021-03-27
  Filled 2021-03-27: qty 1

## 2021-03-27 MED ORDER — DEXTROSE 5% IN WATER (D5W) FLUSH BAG - 250 ML
INTRAVENOUS | Status: DC | PRN
Start: 2021-03-27 — End: 2021-04-02

## 2021-03-27 MED ORDER — SUGAMMADEX 100 MG/ML INTRAVENOUS SOLUTION
Freq: Once | INTRAVENOUS | Status: DC | PRN
Start: 2021-03-27 — End: 2021-03-27
  Administered 2021-03-27: 200 mg via INTRAVENOUS

## 2021-03-27 MED ORDER — FENTANYL (PF) 50 MCG/ML INJECTION SOLUTION
INTRAMUSCULAR | Status: AC
Start: 2021-03-27 — End: 2021-03-27
  Filled 2021-03-27: qty 2

## 2021-03-27 MED ORDER — FENTANYL (PF) 50 MCG/ML INJECTION SOLUTION
Freq: Once | INTRAMUSCULAR | Status: DC | PRN
Start: 2021-03-27 — End: 2021-03-27
  Administered 2021-03-27 (×2): 50 ug via INTRAVENOUS

## 2021-03-27 MED ORDER — LACTATED RINGERS INTRAVENOUS SOLUTION
INTRAVENOUS | Status: DC
Start: 2021-03-27 — End: 2021-03-28

## 2021-03-27 MED ORDER — MIDAZOLAM (PF) 1 MG/ML INJECTION SOLUTION
Freq: Once | INTRAMUSCULAR | Status: DC | PRN
Start: 2021-03-27 — End: 2021-03-27
  Administered 2021-03-27: 2 mg via INTRAVENOUS

## 2021-03-27 MED ORDER — LIDOCAINE (PF) 20 MG/ML (2 %) INJECTION SOLUTION
INTRAMUSCULAR | Status: AC
Start: 2021-03-27 — End: 2021-03-27
  Filled 2021-03-27: qty 5

## 2021-03-27 MED ORDER — SODIUM CHLORIDE 0.9 % (FLUSH) INJECTION SYRINGE
2.0000 mL | INJECTION | Freq: Three times a day (TID) | INTRAMUSCULAR | Status: DC
Start: 2021-03-27 — End: 2021-04-02
  Administered 2021-03-27: 2 mL
  Administered 2021-03-27: 0 mL
  Administered 2021-03-28: 6 mL
  Administered 2021-03-28 (×2): 0 mL
  Administered 2021-03-29: 6 mL
  Administered 2021-03-29 – 2021-03-30 (×4): 0 mL
  Administered 2021-03-30: 6 mL
  Administered 2021-03-31: 2 mL
  Administered 2021-03-31 – 2021-04-02 (×7): 0 mL

## 2021-03-27 MED ORDER — LACTATED RINGERS INTRAVENOUS SOLUTION
INTRAVENOUS | Status: DC
Start: 2021-03-27 — End: 2021-03-28
  Administered 2021-03-27: 0 mL via INTRAVENOUS

## 2021-03-27 MED ORDER — ONDANSETRON HCL (PF) 4 MG/2 ML INJECTION SOLUTION
INTRAMUSCULAR | Status: AC
Start: 2021-03-27 — End: 2021-03-27
  Filled 2021-03-27: qty 2

## 2021-03-27 MED ORDER — ROCURONIUM 10 MG/ML INTRAVENOUS SOLUTION
Freq: Once | INTRAVENOUS | Status: DC | PRN
Start: 2021-03-27 — End: 2021-03-27
  Administered 2021-03-27: 20 mg via INTRAVENOUS
  Administered 2021-03-27: 80 mg via INTRAVENOUS

## 2021-03-27 MED ORDER — LIDOCAINE (PF) 100 MG/5 ML (2 %) INTRAVENOUS SYRINGE
INJECTION | Freq: Once | INTRAVENOUS | Status: DC | PRN
Start: 2021-03-27 — End: 2021-03-27
  Administered 2021-03-27: 100 mg via INTRAVENOUS

## 2021-03-27 MED ORDER — ONDANSETRON HCL (PF) 4 MG/2 ML INJECTION SOLUTION
4.0000 mg | Freq: Once | INTRAMUSCULAR | Status: DC | PRN
Start: 2021-03-27 — End: 2021-03-28
  Administered 2021-03-27: 4 mg via INTRAVENOUS
  Filled 2021-03-27: qty 2

## 2021-03-27 MED ORDER — PROPOFOL 10 MG/ML IV BOLUS
INJECTION | Freq: Once | INTRAVENOUS | Status: DC | PRN
Start: 2021-03-27 — End: 2021-03-27
  Administered 2021-03-27: 150 mg via INTRAVENOUS
  Administered 2021-03-27: 50 mg via INTRAVENOUS

## 2021-03-27 MED ORDER — PROPOFOL 10 MG/ML INTRAVENOUS EMULSION
INTRAVENOUS | Status: DC | PRN
Start: 2021-03-27 — End: 2021-03-27
  Administered 2021-03-27 (×3): 150 ug/kg/min via INTRAVENOUS
  Administered 2021-03-27: 200 ug/kg/min via INTRAVENOUS
  Administered 2021-03-27: 0 ug/kg/min via INTRAVENOUS
  Administered 2021-03-27 (×2): 175 ug/kg/min via INTRAVENOUS

## 2021-03-27 MED ORDER — LACTATED RINGERS INTRAVENOUS SOLUTION
INTRAVENOUS | Status: DC | PRN
Start: 2021-03-27 — End: 2021-03-27
  Administered 2021-03-27: 0 via INTRAVENOUS

## 2021-03-27 MED ORDER — SODIUM CHLORIDE 0.9 % (FLUSH) INJECTION SYRINGE
2.0000 mL | INJECTION | INTRAMUSCULAR | Status: DC | PRN
Start: 2021-03-27 — End: 2021-04-02

## 2021-03-27 MED ORDER — ONDANSETRON HCL (PF) 4 MG/2 ML INJECTION SOLUTION
Freq: Once | INTRAMUSCULAR | Status: DC | PRN
Start: 2021-03-27 — End: 2021-03-27
  Administered 2021-03-27: 4 mg via INTRAVENOUS

## 2021-03-27 MED ORDER — DEXAMETHASONE SODIUM PHOSPHATE 4 MG/ML INJECTION SOLUTION
Freq: Once | INTRAMUSCULAR | Status: DC | PRN
Start: 2021-03-27 — End: 2021-03-27
  Administered 2021-03-27: 8 mg via INTRAVENOUS

## 2021-03-27 MED ORDER — MAGNESIUM SULFATE 4 GRAM/100 ML (4 %) IN WATER INTRAVENOUS PIGGYBACK
4.0000 g | INJECTION | INTRAVENOUS | Status: AC
Start: 2021-03-27 — End: 2021-03-27
  Administered 2021-03-27: 4 g via INTRAVENOUS
  Administered 2021-03-27: 0 g via INTRAVENOUS
  Filled 2021-03-27: qty 100

## 2021-03-27 MED ORDER — PHENYLEPHRINE 1 MG/10 ML (100 MCG/ML) IN 0.9 % SOD.CHLORIDE IV SYRINGE
INJECTION | Freq: Once | INTRAVENOUS | Status: DC | PRN
Start: 2021-03-27 — End: 2021-03-27
  Administered 2021-03-27 (×5): 100 ug via INTRAVENOUS

## 2021-03-27 MED ORDER — FENTANYL (PF) 50 MCG/ML INJECTION SOLUTION
12.5000 ug | INTRAMUSCULAR | Status: DC | PRN
Start: 2021-03-27 — End: 2021-03-28

## 2021-03-27 MED ORDER — EPHEDRINE SULFATE 5 MG/ML INTRAVENOUS SOLUTION
INTRAVENOUS | Status: AC
Start: 2021-03-27 — End: 2021-03-27
  Filled 2021-03-27: qty 10

## 2021-03-27 MED ORDER — ALBUTEROL SULFATE 2.5 MG/3 ML (0.083 %) SOLUTION FOR NEBULIZATION
2.5000 mg | INHALATION_SOLUTION | Freq: Once | RESPIRATORY_TRACT | Status: DC | PRN
Start: 2021-03-27 — End: 2021-03-27
  Filled 2021-03-27: qty 3

## 2021-03-27 MED ORDER — IPRATROPIUM 0.5 MG-ALBUTEROL 3 MG (2.5 MG BASE)/3 ML NEBULIZATION SOLN
3.0000 mL | INHALATION_SOLUTION | RESPIRATORY_TRACT | Status: AC
Start: 2021-03-27 — End: 2021-03-27
  Administered 2021-03-27 (×2): 3 mL via RESPIRATORY_TRACT

## 2021-03-27 MED ORDER — SUGAMMADEX 100 MG/ML INTRAVENOUS SOLUTION
INTRAVENOUS | Status: AC
Start: 2021-03-27 — End: 2021-03-27
  Filled 2021-03-27: qty 2

## 2021-03-27 MED ORDER — PHENYLEPHRINE 1 MG/10 ML (100 MCG/ML) IN 0.9 % SOD.CHLORIDE IV SYRINGE
INJECTION | INTRAVENOUS | Status: AC
Start: 2021-03-27 — End: 2021-03-27
  Filled 2021-03-27: qty 10

## 2021-03-27 SURGICAL SUPPLY — 56 items
ADAPTER BRONCHSCP ILLUMISITE OLMPS (ENDOSCOPIC SUPPLIES) ×1 IMPLANT
ADAPTER BRONCHSCP ILLUMISITE O_LMPS (INSTRUMENTS ENDOMECHANICAL) ×1
ARMBOARD IV FM PSTNR (IV TUBING & ACCESSORIES) ×2
ARMBRD POSITION 20X8X2IN DVN FOAM (IV TUBING & ACCESSORIES) ×1 IMPLANT
BALLOON ENDOS STRL LTX BF-UC160F (ENDOSCOPIC SUPPLIES) ×1 IMPLANT
BALLOON ENDOS STRL LTX BFUC160_F (INSTRUMENTS ENDOMECHANICAL) ×1
BASIN MED GRAD 32OZ BLU STRL (MED SURG SUPPLIES) ×1
BLANKET MISTRAL-AIR ADULT LWR BODY 55.9X40.2IN FRC AIR HI VOL BLWR INTUITIVE CONTROL PNL LRG LED (MED SURG SUPPLIES) ×1 IMPLANT
BLANKET WARMER 55.9INW X 40.2I_LOWER BODY MISTRAL-AIR (MED SURG SUPPLIES) ×1
BRUSH BIOPSY 10/BX_SUPER-TRAX (INSTRUMENTS ENDOMECHANICAL) ×1
BRUSH CYTO SUPERDIMENSION STRL DISP LF (ENDOSCOPIC SUPPLIES) ×1 IMPLANT
CONV USE 320027 - CHILDRENS USE 320025 - KIT RM TURNOVER CSTM NONST LF (DRAPE/PACKS/SHEETS/OR TOWEL) ×1
CONV USE 320027 - CHILDRENS USE 320025 - KIT RM TURNOVER CUSTOM NONST LF (DRAPE/PACKS/SHEETS/OR TOWEL) ×1 IMPLANT
CONV USE ITEM 337890 - PACK SURG BSIN 2 STRL LF  DISP (CUSTOM TRAYS & PACK) ×1 IMPLANT
COVER HVDTY REINF LF  DISP 110X79IN TBL STRL (DRAPE/PACKS/SHEETS/OR TOWEL) ×1 IMPLANT
COVER HVDTY REINF LF DISP 110_X79IN TBL STRL (DRAPE/PACKS/SHEETS/OR TOWEL) ×1
COVER WAND RFD STRL 50EA/CS_01-0020 (DRAPE/PACKS/SHEETS/OR TOWEL) ×1
COVER WND RF DETECT STRL CLR EQP (DRAPE/PACKS/SHEETS/OR TOWEL) ×1 IMPLANT
CUP MED 2OZ PLASTIC GRAD STRL LF  DISP (MED SURG SUPPLIES) ×1 IMPLANT
CUP MED 2OZ PLASTIC GRAD STRL_LF DISP (MED SURG SUPPLIES) ×1
DISCONTINUED USE 338708 - BASIN MED GRAD 32OZ BLU STRL (MED SURG SUPPLIES) ×1
DISCONTINUED USE 340506 - BASIN MED GRAD 32OZ BLU STRL (MED SURG SUPPLIES) ×1 IMPLANT
FORCEPS BIOPSY PREMARK REINF SHAFT PROPRIETARY SHEATH ENH CLMN STRTH 110MM 1.7MM SUPERDIMENSION (ENDOSCOPIC SUPPLIES) ×1 IMPLANT
FORCEPS ENDOS PREMARK REINF SH_AFT SHEATH SMOOTH 110MM 1.7MM (INSTRUMENTS ENDOMECHANICAL) ×1
GARMENT COMPRESS MED CALF CENTAURA NYL VASOGRAD LTWT BRTHBL SEQ FIL BLU 18- IN (MED SURG SUPPLIES) ×1 IMPLANT
GARMENT COMPRESS MED CALF CENT_AURA NYL VASOGRAD LTWT BRTHBL (MED SURG SUPPLIES) ×1
KIT ENDOS 1070MM 2.7MM CATH STRL LF  REUSE ENDBR 90D (ENDOSCOPIC SUPPLIES) ×1 IMPLANT
KIT ENDOS 1070MM 2.7MM CATH ST_RL LF REUSE ENDBR 180D (INSTRUMENTS ENDOMECHANICAL) ×1
KIT ENDOS 1070MM 2.7MM CATH ST_RL LF REUSE ENDBR 90D (INSTRUMENTS ENDOMECHANICAL) ×1
KIT NAVIGATE ILLUMISITE 180D ENDBR (ENDOSCOPIC SUPPLIES) ×1 IMPLANT
KIT ROOM TURNOVER ~~LOC~~ CUSTOM (DRAPE/PACKS/SHEETS/OR TOWEL) ×1
LABEL E-Z STICK_STLEZP1 100EA/CS (MED SURG SUPPLIES) ×1
LABEL MED EZ PEEL MRKR LF (MED SURG SUPPLIES) ×1 IMPLANT
NEEDLE ASP 130MM 21GA 1.8MM SUPERDIMENSION PREMARK 2MM WRK CHNL (ENDOSCOPIC SUPPLIES) ×1 IMPLANT
NEEDLE ASP 130MM 21GA 1.8MM SU_PERDIMENSION PREMARK 2MM WRK (INSTRUMENTS ENDOMECHANICAL) ×1
NEEDLE ASP 19GA 2MM VIZISHOT F_LXB (INSTRUMENTS ENDOMECHANICAL) ×1
NEEDLE ASP 21GA 2MM VIZISHOT STRL DISP (MED SURG SUPPLIES) ×1 IMPLANT
NEEDLE ASP 21GA 2MM VIZISHOT S_TRL DISP (MED SURG SUPPLIES) ×1
NEEDLE ASP 2MM 19GA VIZISHOT (ENDOSCOPIC SUPPLIES) ×1 IMPLANT
PACK BASIN DBL CUSTOM (CUSTOM TRAYS & PACK) ×1
PATCH SENSOR ILLUMISITE NONST LF  DISP (ENDOSCOPIC SUPPLIES) ×3 IMPLANT
PATCH SENSOR ILLUMISITE NONST_LF DISP (INSTRUMENTS ENDOMECHANICAL) ×3
POSITION OR RSPBRY SWIRL 8X8.5X4IN DVN HEAD POLYUR FOAM SLOT CRDL (MED SURG SUPPLIES) ×1 IMPLANT
POSITION POSITION HEAD FOAM SL_OT (MED SURG SUPPLIES) ×1
SPONGE GAUZE 4X4IN MDCHC COTTON 12 PLY TY 7 LF  STRL DISP (WOUND CARE SUPPLY) ×2 IMPLANT
SPONGE GAUZE 4X4IN MDCHC COTTO_N 12 PLY TY 7 LF STRL DISP (WOUND CARE/ENTEROSTOMAL SUPPLY) ×2
STRAP POSITION KNEE FOAM SFT ADJ CNTCT CLSR LF (MED SURG SUPPLIES) ×1 IMPLANT
STRAP POSITION KNEE FOAM SFT A_DJ CNTCT CLSR LF (MED SURG SUPPLIES) ×1
SYRINGE 60ML LF  STRL LS MED STD (MED SURG SUPPLIES) ×1 IMPLANT
SYRINGE 60ML LF STRL LS MED S_TD (MED SURG SUPPLIES) ×1
VALVE BIOPSY ADPR STRL DISP (ENDOSCOPIC SUPPLIES) ×1 IMPLANT
VALVE BIOPSY US BRONCHSCP STRL LF  DISP (ENDOSCOPIC SUPPLIES) ×1 IMPLANT
VALVE BRONCHOSCOPE BIOPSY_MAJ-1414 STRL SNGL-USE LF (INSTRUMENTS ENDOMECHANICAL) ×1
VALVE BRONCHOSCOPE SUCT MAJ209_20 EA/BX (INSTRUMENTS ENDOMECHANICAL) ×1
VALVE BRONCHOSCOPE SUCT MAJ210_STRL DISP 20EA/BX (INSTRUMENTS ENDOMECHANICAL) ×1
VALVE SUCT FLXB ATTACH BRONCHSCP STRL LF  DISP (ENDOSCOPIC SUPPLIES) ×1 IMPLANT

## 2021-03-27 NOTE — Anesthesia Preprocedure Evaluation (Signed)
ANESTHESIA PRE-OP EVALUATION  Andrena Mews  Planned Procedure: ELECTRO MAGNETIC NAVIGATION BRONCHOSCOPY ([facility])  ULTRASOUND ENDOBRONCHIAL ([facility])  Review of Systems  Anesthesia Complications comment: Cervical spinal stenosis with painful neck extension.    anesthesia history negative     patient summary reviewed  nursing notes reviewed        Pulmonary   COPD and moderate,   Cardiovascular    ECG reviewed , Exercise Tolerance: <4 METS        GI/Hepatic/Renal    Remote GERD, rarely has symptoms and treats with OTCs        Endo/Other         Neuro/Psych/MS    Cervical spinal stenosis with painful neck extension., back abnormality     Cancer    lung cancer,                   Physical Assessment      Airway     Comment: Cervical spinal stenosis with painful neck extension.    Mallampati: IV    TM distance: >3 FB    Neck ROM: limited  Mouth Opening: good.            Dental                    Pulmonary    Breath sounds clear to auscultation       Cardiovascular    Rhythm: regular  Rate: Normal       Other findings            Plan  ASA 3     Planned anesthesia type: general     general anesthesia with endotracheal tube intubation                      Anesthesia issues/risks discussed are: Dental Injuries, Nerve Injuries, Eye /Visual Loss, PONV, Cardiac Events/MI, Stroke, Intraoperative Awareness/ Recall, Aspiration, Difficult Airway and Sore Throat.  Anesthetic plan and risks discussed with patient  Signed consent obtained        Use of blood products discussed with patient who consented to blood products.     Patient's NPO status is appropriate for Anesthesia.           Plan discussed with CRNA.    (R/B discussed.  Cervical spine stenosis with limited neck extension. Plan GETA with Glide-scope or C-MAC for intubation.  Discussed potential for postoperative intubation and mechanical ventilation.)

## 2021-03-27 NOTE — Anesthesia Transfer of Care (Signed)
ANESTHESIA TRANSFER OF CARE   Alexander Richard is a 61 y.o. ,male, Weight: 98.6 kg (217 lb 6 oz)   had Procedure(s):  ELECTRO MAGNETIC NAVIGATION BRONCHOSCOPY  ULTRASOUND ENDOBRONCHIAL  performed  03/27/21   Primary Service: Hatim Al-Jaroushi, *    No past medical history on file.   Allergy History as of 03/27/21      No Known Allergies              I completed my transfer of care / handoff to the receiving personnel during which we discussed:  Access, Airway, All key/critical aspects of case discussed, Analgesia, Antibiotics, Expectation of post procedure, Fluids/Product, Gave opportunity for questions and acknowledgement of understanding, Labs and PMHx    Post Location: PACU                                          Additional Info:Patient transported to PACU SFM and SO at 8L/min. VSS. Report given to RN.                         Last OR Temp: Temperature: 36.4 C (97.5 F)  ABG:  POTASSIUM   Date Value Ref Range Status   03/27/2021 3.8 3.5 - 5.1 mmol/L Final     CALCIUM   Date Value Ref Range Status   03/27/2021 8.8 8.8 - 10.2 mg/dL Final     Airway:* No LDAs found *  Blood pressure (!) 127/102, pulse (!) 103, temperature 36.4 C (97.5 F), resp. rate (!) 22, height 1.803 m (5\' 11" ), weight 98.6 kg (217 lb 6 oz), SpO2 94 %.

## 2021-03-27 NOTE — Consults (Signed)
[facility]  Pulmonary Consult Follow-Up Note     Damein, Gaunce, 61 y.o. male  Date of Service: 03/27/2021  Date of Birth:  May 30, 1960    Assessment/Recommendations:  Jaquae Rieves is a 61 y/o male with PMH of spinal stenosis/chronic back pain, GERD, and tobacco use who was admitted to [facility] on 03/22/2021 as a transfer from [place] for acute hypoxic respiratory failure in the setting of mass-like airspace consolidation occupying the right middle and lower lobes (with regions of focal internal air suggestive of underlying necrosis or developing abscess). Pulmonary medicine  consulted for EBUS with biopsy.    EBUS performed today from [place]. Endobronchial lesion was visualized and biopsy done along with a  Bronchoalveolar lavage which showed frank puss on the right side. Will need to determine course of antibiotics. Suspect may need a longer duration of antibiotics.    Acute hypoxic respiratory failure  Right middle and lower lobe mass-like consolidation, concern for malignancy vs infection  Possible underlying COPD  Tobacco use    Plan  -Recommend ID consult to determine duration of antibiotics in this patient with large mass like consolidations and visible puss on Bronchoscopy on right side. Suspected abscess.  - Follow up BAL and FNA pathology  -Continue aggressive pulmonary toilet, frequent incentive spirometry Q1hr while awake (with frequent reminders from nursing staff/RT), OOB to chair at least TID, and PT/OT evaluations if not already completed.    Thank you for this consultation. We will continue to follow. Please do not hesitate to reach out to our service should you have any additional questions or concerns.    Chief Complaint: Hemoptysis, shortness of breath, fever  Subjective: Patient states that he feels much better today. He is continuing to have some pleuritic chest pain on the right side. Otherwise denies any new symptoms or  complaints.    Objective:  Temperature: 36.4 C (97.5 F)  Heart Rate: (!) 103  BP (Non-Invasive): (!) 127/102  Respiratory Rate: (!) 22  SpO2: 94 %  General: Alert, pleasant. In no acute distress.  HEENT: EOMI, normal conjunctiva/sclera. MMM. Oropharynx clear.  Neck: Supple, trachea midline.  Respiratory: Scattered, subtle expiratory wheezing appreciated. Normal work of breathing on NC.  Cardiac: Regular rate and rhythm. No murmurs appreciated.  GI: Soft, non-tender, and non-distended abdomen.  MSK: Moves all extremities. Distal pulses intact. No peripheral edema.  Skin: Warm and dry without visible rashes. Multiple tattoos noted over the extremities.  Neuro: Alert and oriented x3. No obvious focal deficits.      Labs: I have reviewed all recent lab results. Leukocytosis still noted, but appears to be slowly resolving. BMP largely unremarkable.    Imaging Studies: I have reviewed all recent imaging results. CT chest with IV contrast from 03/22/2021: 1. Regions of masslike airspace consolidation occupying the right middle and lower lobes. Regions of focal internal air suggest underlying necrosis or developing abscess which can be seen with infection or malignancy. Comparison to any recent previous imaging would be of benefit, otherwise short-term follow-up is recommended after a period of therapy. Background changes of hyperinflation and emphysematous change. 2. Minimal right-sided pleural thickening and/or fluid. Borderline to mildly enlarged mediastinal and right hilar lymph nodes may be reactive assessment on short-term follow-up is recommended. 3. Atherosclerotic disease and coronary vascular calcification.      Tressie Ellis  MD  Pulmonary and Critical Care Fellow  [facility] Medicine      I saw and examined  the patient.  I reviewed the fellow's note.  I agree with the findings and plan of care as documented in the fellow's note.  Any exceptions/additions are edited/noted.    Suann Larry, MD  Assistant  Professor, Section of Pulmonary, Critical Care, and Sleep Medicine  [facility] Department of Medicine  03/27/2021 19:19

## 2021-03-27 NOTE — Nurses Notes (Addendum)
1424-Arrived in Bronch suite by stretcher on NC at 3LPM with CRNA and RN. For bronchoscopy with biopsy under general anesthesia with Dr.Leonard. Monitoring instituted per policy. SR,rate 80's with O2 sat 95%.Moved self to fluoro table.  1455-Dr.Swanson here for anesthesia. 1505-Induction/intubationcomplete. Timeout completed. 1506-Procedure started. 1636-Procedure completed. 1645-Extubated to FM. Cough productive of large amts sputum. Transported to recovery(area D,bed 7). Report at bedside to S.Parker,RN. All questions answered.

## 2021-03-27 NOTE — OR PostOp (Signed)
Pulmonary fellow at bedside to speak with patient. Okayed to send back to inpatient room.

## 2021-03-27 NOTE — Care Management Notes (Signed)
[facility]  Care Management Note    Patient Name: Alexander Richard  Date of Birth: 25-Nov-1960  Sex: male  Date/Time of Admission: 03/22/2021  4:29 PM  Room/Bed: 2WS/2WS  Payor: [facility] [employer] / Plan: Evette Doffing VACCN/OPTUM / Product Type: Managed Care /    LOS: 5 days   Primary Care Providers:  Morton Amy, MD, MD (General)    Admitting Diagnosis:  Sepsis (CMS [facility]) [A41.9]    Assessment:      03/27/21 1556   Assessment Details   Assessment Type Continued Assessment   Date of Care Management Update 03/27/21   Date of Next DCP Update 03/30/21   Care Management Plan   Discharge Planning Status plan in progress   Projected Discharge Date 03/31/21   Discharge Needs Assessment   Discharge Facility/Level of Care Needs Home (Patient/Family Member/other)(code 1);Home with DME (code 1)         Discharge Plan:  Home vs home with Home Health, Home with DME (code 1)  Pt continues to require medical treatment. Remains on O2, will need O2 challenge at d/c if pt is to remain on O2. Therapy recommending home vs home with [facility] - MSW attempting to meet with pt to discuss d/c planning however pt off floor. Per chart, pt is having bronch with pulm today. Anticipate d/c home vs home with [facility] with possible DME when deemed medically stable. Will continue to follow.     The patient will continue to be evaluated for developing discharge needs.     Case Manager: Rosaura Carpenter, [place]  Phone: [phone]

## 2021-03-27 NOTE — Procedures (Signed)
Endobronchial Ultrasound with FNA/ Bronchoscopy with BAL and Endobronchial Biopsy      Procedure Date:  03/27/2021 Time:  3:00 pm  Procedure: Bronchscopy with endobronchial ultrasound with FNA to station 7, 10R   lymph nodes, BAL and Endobronchial lesion Forcepts biopsy  Diagnosis:  RLL Cavitary Lesion  Indication:  Hilar Lymphadenopathy with RLL Cavitary lesion    Description: After informed consent the patient was positioned. A surgical time out was performed to confirm correct patient and procedure. General anesthesia and sterile conditions endotracheal route. The carina was sharp with no tracheomalacia observed. All airways were inspected to the level of segmental bronchi and were patent. Airways/Anatomy appeared Normal. 1 endobronchial lesion was visualized in RLL Superior segment. A Large amount of pus like secretions were encountered in Right mainstem and all right sided segments. These were aspirated as completely as possible    The bronchoscopy was performed included EBUS. Endobronchial ultrasound (EBUS) was performed and showed prominent Station 7 and [place], small Station [employer]. These lymph nodes were all very vascular.  A 19G needle was then introduced for sampling with 5-10cc suction. 4x FNA passes were done of the station 7 lymph nodes and 6x FNA passes were done of the station 10 R lymph nodes( 1 of which was sent for tissue culture) . Cyto tech and pathologist were present during the procedure and several biopsies with adequate tissue was studied.     After EBUS was concluded, bronchoscope was reintroduced and airways were aspirated as completely as possible. Saline lavage was completed.. There were no signs of active bleeding. A large amount of blood mixed with pus secretions were encountered on all segments of the right side and were aspirated as completely as possible.  No active bleeding was identified.    Bronchoalveolar lavage was performed from Right Middle lobe and Right Lower Lobe and sent  for cell count/differential, gram stain/culture, fungus culture, AFB, legionella, and cytopathology.    We then went into the RLL Superior segment and performed 5 forceps biopsy was performed and sent for tissue culture/AFB/Fungal culture    Patient tolerated procedure without complications. A CXR was ordered at the completion of the procedure.       Dr. Burman Nieves, Dr. Lupita Raider, Dr. Council Mechanic assisted with entire procedure.    Dr. Hollice Espy supervised the entire procedure    Tressie Ellis MD  Fellow, Pulmonary & Critical Care Medicine              I was present and supervised/observed the entire procedure.    Suann Larry, MD  Assistant Professor, Section of Pulmonary, Critical Care, and Sleep Medicine  [facility] Department of Medicine  03/27/2021 17:16

## 2021-03-27 NOTE — Anesthesia Postprocedure Evaluation (Signed)
Anesthesia Post Op Evaluation    Patient: Alexander Richard  Procedure(s):  ELECTRO MAGNETIC NAVIGATION BRONCHOSCOPY  ULTRASOUND ENDOBRONCHIAL    Last Vitals:Temperature: 36.4 C (97.5 F) (03/27/21 1655)  Heart Rate: (!) 103 (03/27/21 1655)  BP (Non-Invasive): (!) 127/102 (03/27/21 1655)  Respiratory Rate: (!) 22 (03/27/21 1655)  SpO2: 94 % (03/27/21 1655)    No notable events documented.      Patient location during evaluation: PACU       Patient participation: complete - patient participated  Level of consciousness: awake and alert    Pain management: adequate  Airway patency: patent    Anesthetic complications: no  Cardiovascular status: acceptable  Respiratory status: acceptable  Hydration status: acceptable  Patient post-procedure temperature: Pt Normothermic   PONV Status: Absent  Comments: Postoperative DuoNeb treatment and CXR pending;discussed with Dr. Jacques Earthly (Anesthesia-Late Physician).

## 2021-03-27 NOTE — Progress Notes (Addendum)
[facility]  Medicine Progress Note    Alexander Richard  Date of service: 03/27/2021  Date of Admission:  03/22/2021    Hospital Day:  LOS: 5 days   Subjective: Patient was seen and examined at bedside. There were no new issues overnight. Feels better generally except for some pain in the right side when he coughs. Denies fevers, chills, CP, SOB, abd pain, and NVD.      Vital Signs:  Temp  Avg: 36.8 C (98.2 F)  Min: 36.3 C (97.3 F)  Max: 37 C (98.6 F)    Pulse  Avg: 91.4  Min: 82  Max: 103 BP  Min: 101/57  Max: 132/89   Resp  Avg: 18.1  Min: 17  Max: 20 SpO2  Avg: 92.5 %  Min: 90 %  Max: 97 %          Input/Output    Intake/Output Summary (Last 24 hours) at 03/27/2021 1211  Last data filed at 03/27/2021 1133  Gross per 24 hour   Intake 650 ml   Output 600 ml   Net 50 ml    I/O last shift:  03/24 0700 - 03/24 1859  In: 100   Out: 400 [Urine:400]   acetaminophen (TYLENOL) tablet, 650 mg, Oral, Q4H PRN  albuterol (PROVENTIL) 2.5mg / 0.5 mL nebulizer solution, 2.5 mg, Nebulization, Q4H PRN  arformoterol (BROVANA) 15 mcg/2 mL nebulizer solution, 15 mcg, Nebulization, 2x/day  D5W 250 mL flush bag, , Intravenous, Q15 Min PRN  DULoxetine (CYMBALTA) delayed release capsule, 60 mg, Oral, Daily  enoxaparin PF (LOVENOX) 40 mg/0.4 mL SubQ injection, 40 mg, Subcutaneous, Daily  gabapentin (NEURONTIN) capsule, 600 mg, Oral, 3x/day  lidocaine-menthol (LIDOPATCH) 3.6%-1.25% patch, 1 Patch, Transdermal, Daily  LR premix infusion, , Intravenous, Continuous  methocarbamol (ROBAXIN) tablet, 750 mg, Oral, 4x/day  morphine 2 mg/mL injection, 2 mg, Intravenous, Q4H PRN  nicotine (NICODERM CQ) transdermal patch (mg/24 hr), 21 mg, Transdermal, Daily  NS 250 mL flush bag, , Intravenous, Q15 Min PRN  NS flush syringe, 2-6 mL, Intracatheter, Q8HRS  NS flush syringe, 2-6 mL, Intracatheter, Q1 MIN PRN  NS premix infusion, , Intravenous, Continuous  ondansetron (ZOFRAN) 2 mg/mL injection, 4 mg, Intravenous, Q8H  PRN  piperacillin-tazobactam (ZOSYN) 4.5 g in NS 100 mL IVPB, 4.5 g, Intravenous, Q8H  sennosides-docusate sodium (SENOKOT-S) 8.6-50mg  per tablet, 1 Tablet, Oral, 2x/day  tamsulosin (FLOMAX) capsule, 0.4 mg, Oral, Daily after Dinner  tiotropium bromide (SPIRIVA RESPIMAT) 2.5 mcg per inhalation oral inhaler - "Nursing to administer", 2 Puff, Inhalation, Daily  traMADol (ULTRAM) tablet, 50 mg, Oral, Q6H PRN        Physical Exam:  GENERAL: Pleasant, No acute distress  HEENT: Normocephalic, atraumatic, EOMI  NECK: Supple, trachea midline  PULM: rhonchi bilaterally, no wheezes  CV: RRR, systolic murmur  GI: abd soft, nontender, nondistended, normoactive BS  EXTREMITIES: no peripheral edema, pulses intact  NEURO: Cranial nerves 2-12 grossly intact  PSYCH: Alert and oriented x 3      Labs:  CBC Results Differential Results   No results found for this or any previous visit (from the past 30 hour(s)). No results found for this or any previous visit (from the past 30 hour(s)).   BMP Results Other Chemistries Results   Results for orders placed or performed during the hospital encounter of 03/22/21 (from the past 30 hour(s))   BASIC METABOLIC PANEL - AM ONCE    Collection Time: 03/27/21  4:54 AM   Result Value  SODIUM 132 (L)    POTASSIUM 3.8    CHLORIDE 101    CO2 TOTAL 24    GLUCOSE 90    BUN 8    CREATININE 0.56 (L)    Recent Results (from the past 30 hour(s))   MAGNESIUM - AM ONCE    Collection Time: 03/27/21  4:54 AM   Result Value    MAGNESIUM 1.7 (L)      Liver/Pancreas Enzyme Results Liver Function Results   No results found for this or any previous visit (from the past 30 hour(s)). No results found for this or any previous visit (from the past 30 hour(s)).   Cardiac Results Coags Results   No results found for this or any previous visit (from the past 30 hour(s)). Recent Results (from the past 30 hour(s))   PT/INR    Collection Time: 03/27/21  8:51 AM   Result Value    PROTHROMBIN TIME 16.6 (H)    INR 1.43 (H)         Radiology:     No new imaging    PT/OT: Yes    Consults: Pulm      Assessment/ Plan:   Active Hospital Problems    Diagnosis   . Primary Problem: Pneumonia   . 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 Lung Mass vs Pneumonia (cannot rule-out gram negative) vs Abscess  Sepsis (resolved)  COPD exacerbation  - Remains on 2L O2 this AM. Does not wear O2 at home  - CT imaging with consolidation and cavitation at RML and towards lower lose concerning for necrotizing infection  - MRSA negative, sputum culture with oral flora, strep & legionella Ag negative, Biofire viral assay negative; check PCT  - blood cultures (3/20) [id number]  - pulmonary consult, appreciate recs, plan for bronchoscopy/EBUS today  - Duoneb QID and albuterol nebs PRN  - continue Brovana nebulizer BID and Spiriva daily (added this admission)  - continue IV Zosyn for now (start: 3/20)  - aggressive pulmonary toiletry, mobilization, P&V, incentive spirometry  - Tylenol, Tramadol, lidopatch for pain relief    Hyponatremia, mild  - blood osm 289 (WNL) so unclear if true hyponatremia or mild SIADH (related to lung pathology)  - recheck BMP in am    Hypomagnesemia  - mag 1.7  - ordered mag sulfate 4g IV x 1 dose  - recheck mag in am    Nicotine Dependence  - (3/20) counseled 7 minutes on smoking cessation by previous attending  - continue NRT with 21 mg transdermal patch daily    Chronic Pain  Hx of Spinal Stenosis  - continue Neurontin 600 mg TID  - continue Robaxin 750 mg (increased to QID)  - continue Cymbalta 60 mg daily    DVT/PE Prophylaxis: Enoxaparin    Disposition Planning: Home discharge      On the day of the encounter, a total of  36 minutes was spent on this patient encounter including review of historical information, education of current ailment to patient and/or family members, examination,  documentation, and post-visit activities.     Girard Cooter, DO  Hospitalist  03/27/2021

## 2021-03-28 DIAGNOSIS — R918 Other nonspecific abnormal finding of lung field: Secondary | ICD-10-CM

## 2021-03-28 DIAGNOSIS — Z9889 Other specified postprocedural states: Secondary | ICD-10-CM

## 2021-03-28 DIAGNOSIS — J9811 Atelectasis: Secondary | ICD-10-CM

## 2021-03-28 LAB — BASIC METABOLIC PANEL
ANION GAP: 8 mmol/L (ref 4–13)
BUN/CREA RATIO: 17 (ref 6–22)
BUN: 10 mg/dL (ref 8–25)
CALCIUM: 9 mg/dL (ref 8.8–10.2)
CHLORIDE: 103 mmol/L (ref 96–111)
CO2 TOTAL: 24 mmol/L (ref 23–31)
CREATININE: 0.6 mg/dL — ABNORMAL LOW (ref 0.75–1.35)
ESTIMATED GFR: >90 mL/min/BSA (ref >=60)
GLUCOSE: 142 mg/dL — ABNORMAL HIGH (ref 65–125)
POTASSIUM: 3.9 mmol/L (ref 3.5–5.1)
SODIUM: 135 mmol/L — ABNORMAL LOW (ref 136–145)

## 2021-03-28 LAB — MAGNESIUM: MAGNESIUM: 2.1 mg/dL (ref 1.8–2.6)

## 2021-03-28 LAB — CBC
HCT: 31.9 % — ABNORMAL LOW (ref 38.9–52.0)
HGB: 10.3 g/dL — ABNORMAL LOW (ref 13.4–17.5)
MCH: 26.1 pg (ref 26.0–32.0)
MCHC: 32.3 g/dL (ref 31.0–35.5)
MCV: 80.8 fL (ref 78.0–100.0)
MPV: 9.3 fL (ref 8.7–12.5)
PLATELETS: 346 10*3/uL (ref 150–400)
RBC: 3.95 10*6/uL — ABNORMAL LOW (ref 4.50–6.10)
RDW-CV: 15.6 % — ABNORMAL HIGH (ref 11.5–15.5)
WBC: 12.3 10*3/uL — ABNORMAL HIGH (ref 3.7–11.0)

## 2021-03-28 LAB — FUNGUS CULTURE: FUNGAL CULTURE: NO GROWTH

## 2021-03-28 LAB — BRONCHOALVEOLAR LAVAGE (BAL) QUANTITATIVE CULTURE/GRAM STAIN
BRONCHOALVEOLAR LAVAGE (BAL) QUANTITATIVE CULTURE: NO GROWTH
BRONCHOALVEOLAR LAVAGE (BAL) QUANTITATIVE CULTURE: NO GROWTH

## 2021-03-28 LAB — ADULT ROUTINE BLOOD CULTURE, SET OF 2 BOTTLES (BACTERIA AND YEAST): BLOOD CULTURE, ROUTINE: NO GROWTH

## 2021-03-28 LAB — TISSUE CULTURE (AEROBIC CULT & GRAM STAIN): GRAM STAIN: NONE SEEN

## 2021-03-28 NOTE — Progress Notes (Signed)
[facility]  Medicine Progress Note    Alexander Richard  Date of service: 03/28/2021  Date of Admission:  03/22/2021    Hospital Day:  LOS: 6 days   Subjective: Patient was seen and examined at bedside. There were no new issues overnight. Feels better after bronch yesterday. Denies fevers, chills, CP, SOB, abd pain, and NVD.      Vital Signs:  Temp  Avg: 36.4 C (97.6 F)  Min: 36.1 C (97 F)  Max: 36.7 C (98.1 F)    Pulse  Avg: 90.2  Min: 59  Max: 112 BP  Min: 92/55  Max: 130/110   Resp  Avg: 20.8  Min: 16  Max: 35 SpO2  Avg: 93.9 %  Min: 85 %  Max: 98 %          Input/Output    Intake/Output Summary (Last 24 hours) at 03/28/2021 1521  Last data filed at 03/28/2021 1232  Gross per 24 hour   Intake 1120 ml   Output 600 ml   Net 520 ml    I/O last shift:  03/25 0700 - 03/25 1859  In: 520 [P.O.:320]  Out: -    acetaminophen (TYLENOL) tablet, 650 mg, Oral, Q4H PRN  albuterol (PROVENTIL) 2.5mg / 0.5 mL nebulizer solution, 2.5 mg, Nebulization, Q4H PRN  arformoterol (BROVANA) 15 mcg/2 mL nebulizer solution, 15 mcg, Nebulization, 2x/day  D5W 250 mL flush bag, , Intravenous, Q15 Min PRN  D5W 250 mL flush bag, , Intravenous, Q15 Min PRN  DULoxetine (CYMBALTA) delayed release capsule, 60 mg, Oral, Daily  enoxaparin PF (LOVENOX) 40 mg/0.4 mL SubQ injection, 40 mg, Subcutaneous, Daily  gabapentin (NEURONTIN) capsule, 600 mg, Oral, 3x/day  lidocaine-menthol (LIDOPATCH) 3.6%-1.25% patch, 1 Patch, Transdermal, Daily  methocarbamol (ROBAXIN) tablet, 750 mg, Oral, 4x/day  morphine 2 mg/mL injection, 2 mg, Intravenous, Q4H PRN  nicotine (NICODERM CQ) transdermal patch (mg/24 hr), 21 mg, Transdermal, Daily  NS 250 mL flush bag, , Intravenous, Q15 Min PRN  NS 250 mL flush bag, , Intravenous, Q15 Min PRN  NS flush syringe, 2-6 mL, Intracatheter, Q8HRS  NS flush syringe, 2-6 mL, Intracatheter, Q1 MIN PRN  NS flush syringe, 2-6 mL, Intracatheter, Q8HRS  NS flush syringe, 2-6 mL, Intracatheter, Q1 MIN PRN  NS premix infusion, ,  Intravenous, Continuous  ondansetron (ZOFRAN) 2 mg/mL injection, 4 mg, Intravenous, Q8H PRN  piperacillin-tazobactam (ZOSYN) 4.5 g in NS 100 mL IVPB, 4.5 g, Intravenous, Q8H  sennosides-docusate sodium (SENOKOT-S) 8.6-50mg  per tablet, 1 Tablet, Oral, 2x/day  tamsulosin (FLOMAX) capsule, 0.4 mg, Oral, Daily after Dinner  tiotropium bromide (SPIRIVA RESPIMAT) 2.5 mcg per inhalation oral inhaler - "Nursing to administer", 2 Puff, Inhalation, Daily  traMADol (ULTRAM) tablet, 50 mg, Oral, Q6H PRN        Physical Exam:  GENERAL: Pleasant, No acute distress  HEENT: Normocephalic, atraumatic  NECK: Supple, trachea midline  PULM: rhonchi bilaterally, however much improved. No wheezes  CV: RRR, systolic murmur  GI: abd soft, nontender, nondistended, normoactive BS  EXTREMITIES: no peripheral edema, pulses intact  NEURO: Cranial nerves 2-12 grossly intact  PSYCH: Alert and oriented x 3      Labs:  CBC Results Differential Results   No results found for this or any previous visit (from the past 30 hour(s)). No results found for this or any previous visit (from the past 30 hour(s)).   BMP Results Other Chemistries Results   Results for orders placed or performed during the hospital encounter of 03/22/21 (from  the past 30 hour(s))   BASIC METABOLIC PANEL - AM ONCE    Collection Time: 03/28/21  4:48 AM   Result Value    SODIUM 135 (L)    POTASSIUM 3.9    CHLORIDE 103    CO2 TOTAL 24    GLUCOSE 142 (H)    BUN 10    CREATININE 0.60 (L)    Recent Results (from the past 30 hour(s))   MAGNESIUM - AM ONCE    Collection Time: 03/28/21  4:48 AM   Result Value    MAGNESIUM 2.1      Liver/Pancreas Enzyme Results Liver Function Results   No results found for this or any previous visit (from the past 30 hour(s)). No results found for this or any previous visit (from the past 30 hour(s)).   Cardiac Results Coags Results   No results found for this or any previous visit (from the past 30 hour(s)). No results found for this or any previous visit  (from the past 30 hour(s)).     Radiology:    Results for orders placed or performed during the hospital encounter of 03/22/21 (from the past 24 hour(s))   XR AP MOBILE CHEST     Status: None    Narrative    [place]  Male, 61 years old.    XR AP MOBILE CHEST performed on 03/27/2021 5:34 PM.    REASON FOR EXAM:  BIOPSIES, MONITOR FOR COMPLICATIONS LIKE PNEUMOTHORAX    TECHNIQUE: 1 views/1 images submitted for interpretation.    COMPARISON:  Chest CT dated 03/23/2021.    FINDINGS:  The heart is enlarged, unchanged. There is linear opacities over the left lower lung zone, as well as a consolidative opacity over the right mid to lower lung zone. This is unchanged from the prior chest CT given differences in technique. No pneumothorax is appreciated. There is blunting of the right costophrenic angle.      Impression    1.Stable lung aeration with right mid to lower lung zone consolidative opacity. Left-sided atelectasis.  2.No pneumothorax.  3.Suspect small right pleural fluid.   XR AP MOBILE CHEST     Status: None    Narrative    Alexander Richard  Male, 61 years old.    XR AP MOBILE CHEST performed on 03/27/2021 6:47 PM.    REASON FOR EXAM:  S/P ebus. Rule out Pneumothorax    TECHNIQUE: 1 views/1 images submitted for interpretation.    COMPARISON:  Same day chest radiograph at 5:00 PM.    FINDINGS:  The heart is normal in size. Lung aeration is unchanged with consolidative opacity over the right mid to lower lung zone, and linear opacities over the left lower lung zone. There is no new area of consolidation. No pneumothorax is appreciated. There may be small right pleural fluid.      Impression    1.Overall stable lung aeration with no pneumothorax.   2.Suspect small right pleural fluid.     I have reviewed imaging    PT/OT: Yes    Consults: Pulm      Assessment/ Plan:   Active Hospital Problems    Diagnosis   . Primary Problem: Pneumonia   . 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 Lung Mass vs Pneumonia (cannot rule-out gram  negative) vs Abscess  Sepsis (resolved)  COPD exacerbation  - Remains on 2L O2 this AM. Does not wear O2 at home  - CT imaging with consolidation and cavitation at RML and towards lower lose concerning for necrotizing infection  - MRSA negative, sputum culture with oral flora, strep & legionella Ag negative, Biofire viral assay negative  - blood cultures (3/20) [id number]  - Pulmonary on board. S/p bronchoscopy/EBUS 3/24. Pus noted around lesion of concern. Possible abscess. Path and labs pending.  - Duoneb QID and albuterol nebs PRN  - continue Brovana nebulizer BID and Spiriva daily (added this admission)  - continue IV Zosyn for now (start: 3/20). WBC trending down  - aggressive pulmonary toiletry, mobilization, P&V, incentive spirometry  - Tylenol, Tramadol, lidopatch for pain relief    Hyponatremia, mild  - blood osm 289 (WNL) so unclear if true hyponatremia or mild SIADH (related to lung pathology)  - recheck BMP in am    Hypomagnesemia  - resolved  - recheck mag in am    Nicotine Dependence  - (3/20) counseled 7 minutes on smoking cessation by previous attending  - continue NRT with 21 mg transdermal patch daily    Chronic Pain  Hx of Spinal Stenosis  - continue Neurontin 600 mg TID  - continue Robaxin 750 mg (increased to QID)  - continue Cymbalta 60 mg daily    DVT/PE Prophylaxis: Enoxaparin    Disposition Planning: Home discharge      On the day of the encounter, a total of  36 minutes was spent on this patient encounter including review of historical information, education of current ailment to patient and/or family members, examination, documentation, and post-visit activities.     Diana Eves, DO  Hospitalist  03/28/2021

## 2021-03-29 LAB — BASIC METABOLIC PANEL
ANION GAP: 8 mmol/L (ref 4–13)
BUN/CREA RATIO: 14 (ref 6–22)
BUN: 8 mg/dL (ref 8–25)
CALCIUM: 8.5 mg/dL — ABNORMAL LOW (ref 8.8–10.2)
CHLORIDE: 104 mmol/L (ref 96–111)
CO2 TOTAL: 25 mmol/L (ref 23–31)
CREATININE: 0.59 mg/dL — ABNORMAL LOW (ref 0.75–1.35)
ESTIMATED GFR: >90 mL/min/BSA (ref >=60)
GLUCOSE: 82 mg/dL (ref 65–125)
POTASSIUM: 3.4 mmol/L — ABNORMAL LOW (ref 3.5–5.1)
SODIUM: 137 mmol/L (ref 136–145)

## 2021-03-29 LAB — MANUAL DIFF AND MORPHOLOGY-SYSMEX
BASOPHIL #: <0.1 10*3/uL (ref <=0.20)
BASOPHIL %: 0 %
EOSINOPHIL #: 0.13 10*3/uL (ref <=0.50)
EOSINOPHIL %: 1 %
LYMPHOCYTE #: 1.13 10*3/uL (ref 1.00–4.80)
LYMPHOCYTE %: 9 %
MONOCYTE #: 0.63 10*3/uL (ref 0.20–1.10)
MONOCYTE %: 5 %
MYELOCYTE %: 1 %
NEUTROPHIL #: 10.58 10*3/uL — ABNORMAL HIGH (ref 1.50–7.70)
NEUTROPHIL %: 84 %
RBC MORPHOLOGY: NORMAL

## 2021-03-29 LAB — CBC WITH DIFF
HCT: 31.1 % — ABNORMAL LOW (ref 38.9–52.0)
HGB: 9.9 g/dL — ABNORMAL LOW (ref 13.4–17.5)
MCH: 26.3 pg (ref 26.0–32.0)
MCHC: 31.8 g/dL (ref 31.0–35.5)
MCV: 82.5 fL (ref 78.0–100.0)
MPV: 9 fL (ref 8.7–12.5)
PLATELETS: 389 10*3/uL (ref 150–400)
RBC: 3.77 10*6/uL — ABNORMAL LOW (ref 4.50–6.10)
RDW-CV: 16 % — ABNORMAL HIGH (ref 11.5–15.5)
WBC: 12.6 10*3/uL — ABNORMAL HIGH (ref 3.7–11.0)

## 2021-03-29 LAB — BRONCHOALVEOLAR LAVAGE (BAL) QUANTITATIVE CULTURE/GRAM STAIN: GRAM STAIN: NONE SEEN

## 2021-03-29 LAB — MAGNESIUM: MAGNESIUM: 1.6 mg/dL — ABNORMAL LOW (ref 1.8–2.6)

## 2021-03-29 LAB — C-REACTIVE PROTEIN(CRP),INFLAMMATION: CRP INFLAMMATION: 65.2 mg/L — ABNORMAL HIGH (ref <8.0)

## 2021-03-29 LAB — TISSUE CULTURE (AEROBIC CULT & GRAM STAIN)

## 2021-03-29 MED ORDER — ARFORMOTEROL 15 MCG/2 ML SOLUTION FOR NEBULIZATION
INHALATION_SOLUTION | RESPIRATORY_TRACT | Status: AC
Start: 2021-03-29 — End: 2021-03-29
  Filled 2021-03-29: qty 4

## 2021-03-29 MED ORDER — SODIUM CHLORIDE 0.9 % INTRAVENOUS SOLUTION
INTRAVENOUS | Status: DC
Start: 2021-03-29 — End: 2021-03-29
  Administered 2021-03-29: 0 mL via INTRAVENOUS

## 2021-03-29 NOTE — Care Plan (Signed)
Pt was free from falls this shift. Pt's pain is being somewhat controlled with scheduled and PRN medications, service is aware. Vital signs were stable during shift. Medications administered without complications observed. Fall precautions maintained and pt verbalized understanding on using call light when needing assistance. No questions or concern at this time.        Problem: Adult Inpatient Plan of Care  Goal: Plan of Care Review  Outcome: Ongoing (see interventions/notes)  Goal: Optimal Comfort and Wellbeing  Outcome: Ongoing (see interventions/notes)  Intervention: Provide Person-Centered Care  Recent Flowsheet Documentation  Taken 03/29/2021 0930 by Cora Collum, RN  Trust Relationship/Rapport:   care explained   choices provided   emotional support provided   empathic listening provided   questions answered   questions encouraged   reassurance provided   thoughts/feelings acknowledged     Problem: Health Knowledge, Opportunity to Enhance (Adult,Obstetrics,Pediatric)  Goal: Knowledgeable about Health Subject/Topic  Description: Patient will demonstrate the desired outcomes by discharge/transition of care.  Outcome: Ongoing (see interventions/notes)  Intervention: Enhance Health Knowledge  Recent Flowsheet Documentation  Taken 03/29/2021 0930 by Cora Collum, RN  Family/Support System Care:   support provided   self-care encouraged  Supportive Measures:   active listening utilized   verbalization of feelings encouraged     Problem: Fall Injury Risk  Goal: Absence of Fall and Fall-Related Injury  Outcome: Ongoing (see interventions/notes)  Intervention: Identify and Manage Contributors  Recent Flowsheet Documentation  Taken 03/29/2021 0930 by Cora Collum, RN  Self-Care Promotion:   independence encouraged   adaptive equipment use encouraged  Intervention: Promote Injury-Free Environment  Recent Flowsheet Documentation  Taken 03/29/2021 0930 by Cora Collum, RN  Safety Promotion/Fall Prevention:   fall  prevention program maintained   nonskid shoes/slippers when out of bed   safety round/check completed     Problem: Pain Acute  Goal: Optimal Pain Control and Function  Outcome: Ongoing (see interventions/notes)  Intervention: Prevent or Manage Pain  Recent Flowsheet Documentation  Taken 03/29/2021 0930 by Cora Collum, RN  Sleep/Rest Enhancement:   awakenings minimized   consistent schedule promoted   relaxation techniques promoted   regular sleep/rest pattern promoted  Intervention: Optimize Psychosocial Wellbeing  Recent Flowsheet Documentation  Taken 03/29/2021 0930 by Cora Collum, RN  Diversional Activities: television  Supportive Measures:   active listening utilized   verbalization of feelings encouraged

## 2021-03-29 NOTE — Progress Notes (Signed)
[facility]  Medicine Progress Note    Alexander Richard  Date of service: 03/29/2021  Date of Admission:  03/22/2021    Hospital Day:  LOS: 7 days   Subjective: Patient was seen and examined at bedside. There were no new issues overnight. C/o right sided pain with coughing. C/o several loose stools. Denies fevers, chills, CP, SOB, abd pain, and Nausea.      Vital Signs:  Temp  Avg: 36.7 C (98 F)  Min: 36.5 C (97.7 F)  Max: 36.9 C (98.5 F)    Pulse  Avg: 81.2  Min: 76  Max: 93 BP  Min: 112/75  Max: 131/76   Resp  Avg: 16.2  Min: 16  Max: 17 SpO2  Avg: 96.4 %  Min: 95 %  Max: 98 %          Input/Output    Intake/Output Summary (Last 24 hours) at 03/29/2021 1244  Last data filed at 03/29/2021 1136  Gross per 24 hour   Intake 1100 ml   Output 1200 ml   Net -100 ml    I/O last shift:  03/26 0700 - 03/26 1859  In: -   Out: 1000 [Urine:1000]   acetaminophen (TYLENOL) tablet, 650 mg, Oral, Q4H PRN  albuterol (PROVENTIL) 2.5mg / 0.5 mL nebulizer solution, 2.5 mg, Nebulization, Q4H PRN  arformoterol (BROVANA) 15 mcg/2 mL nebulizer solution, 15 mcg, Nebulization, 2x/day  D5W 250 mL flush bag, , Intravenous, Q15 Min PRN  D5W 250 mL flush bag, , Intravenous, Q15 Min PRN  DULoxetine (CYMBALTA) delayed release capsule, 60 mg, Oral, Daily  enoxaparin PF (LOVENOX) 40 mg/0.4 mL SubQ injection, 40 mg, Subcutaneous, Daily  gabapentin (NEURONTIN) capsule, 600 mg, Oral, 3x/day  lidocaine-menthol (LIDOPATCH) 3.6%-1.25% patch, 1 Patch, Transdermal, Daily  methocarbamol (ROBAXIN) tablet, 750 mg, Oral, 4x/day  morphine 2 mg/mL injection, 2 mg, Intravenous, Q4H PRN  nicotine (NICODERM CQ) transdermal patch (mg/24 hr), 21 mg, Transdermal, Daily  NS 250 mL flush bag, , Intravenous, Q15 Min PRN  NS 250 mL flush bag, , Intravenous, Q15 Min PRN  NS flush syringe, 2-6 mL, Intracatheter, Q8HRS  NS flush syringe, 2-6 mL, Intracatheter, Q1 MIN PRN  NS flush syringe, 2-6 mL, Intracatheter, Q8HRS  NS flush syringe, 2-6 mL, Intracatheter,  Q1 MIN PRN  ondansetron (ZOFRAN) 2 mg/mL injection, 4 mg, Intravenous, Q8H PRN  piperacillin-tazobactam (ZOSYN) 4.5 g in NS 100 mL IVPB, 4.5 g, Intravenous, Q8H  [Held by provider] sennosides-docusate sodium (SENOKOT-S) 8.6-50mg  per tablet, 1 Tablet, Oral, 2x/day  tamsulosin (FLOMAX) capsule, 0.4 mg, Oral, Daily after Dinner  tiotropium bromide (SPIRIVA RESPIMAT) 2.5 mcg per inhalation oral inhaler - "Nursing to administer", 2 Puff, Inhalation, Daily  traMADol (ULTRAM) tablet, 50 mg, Oral, Q6H PRN        Physical Exam:  GENERAL: Pleasant, No acute distress  HEENT: Normocephalic, atraumatic  NECK: Supple, trachea midline  PULM: CTA bilaterally  CV: RRR, systolic murmur  GI: abd soft, nontender, nondistended, normoactive BS  EXTREMITIES: no peripheral edema, pulses intact  NEURO: Cranial nerves 2-12 grossly intact  PSYCH: Alert and oriented x 3      Labs:  CBC Results Differential Results   No results found for this or any previous visit (from the past 30 hour(s)). No results found for this or any previous visit (from the past 30 hour(s)).   BMP Results Other Chemistries Results   No results found for this or any previous visit (from the past 30 hour(s)). No results found  for this or any previous visit (from the past 30 hour(s)).   Liver/Pancreas Enzyme Results Liver Function Results   No results found for this or any previous visit (from the past 30 hour(s)). No results found for this or any previous visit (from the past 30 hour(s)).   Cardiac Results Coags Results   No results found for this or any previous visit (from the past 30 hour(s)). No results found for this or any previous visit (from the past 30 hour(s)).     Radiology:     No new imaging    PT/OT: Yes    Consults: Pulm      Assessment/ Plan:   Active Hospital Problems    Diagnosis   . Primary Problem: Pneumonia   . 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 Lung Mass vs Pneumonia (cannot rule-out gram negative) vs Abscess  Sepsis (resolved)  COPD exacerbation  - Remains on 2L O2 this AM. Does not wear O2 at home  - CT imaging with consolidation and cavitation at RML and towards lower lose concerning for necrotizing infection  - MRSA negative, sputum culture with oral flora, strep & legionella Ag negative, Biofire viral assay negative  - blood cultures (3/20) NGTD  - Pulmonary on board. S/p bronchoscopy/EBUS 3/24. Pus noted around lesion of concern. Possible abscess. Path and cultures pending.  - Duoneb QID and albuterol nebs PRN  - continue Brovana nebulizer BID and Spiriva daily (added this admission)  - continue IV Zosyn for now (start: 3/20). WBC trending down  - aggressive pulmonary toiletry, mobilization, P&V, incentive spirometry  - Tylenol, Tramadol, lidopatch for pain relief    Hyponatremia, mild  - blood osm 289 (WNL) so unclear if true hyponatremia or mild SIADH (related to lung pathology)  - recheck BMP in am    Hypomagnesemia  - resolved  - recheck mag in am    Nicotine Dependence  - (3/20) counseled 7 minutes on smoking cessation by previous attending  - continue NRT with 21 mg transdermal patch daily    Chronic Pain  Hx of Spinal Stenosis  - continue Neurontin 600 mg TID  - continue Robaxin 750 mg (increased to QID)  - continue Cymbalta 60 mg daily    DVT/PE Prophylaxis: Enoxaparin    Disposition Planning: Home discharge      On the day of the encounter, a total of  39 minutes was spent on this patient encounter including review of historical information, education of current ailment to patient and/or family members, examination, documentation, and post-visit activities.     Girard Cooter, DO  Hospitalist  03/29/2021

## 2021-03-30 ENCOUNTER — Encounter (HOSPITAL_COMMUNITY): Payer: Self-pay | Admitting: Internal Medicine

## 2021-03-30 DIAGNOSIS — F17210 Nicotine dependence, cigarettes, uncomplicated: Secondary | ICD-10-CM

## 2021-03-30 DIAGNOSIS — J852 Abscess of lung without pneumonia: Secondary | ICD-10-CM

## 2021-03-30 LAB — BASIC METABOLIC PANEL
ANION GAP: 6 mmol/L (ref 4–13)
BUN/CREA RATIO: 9 (ref 6–22)
BUN: 6 mg/dL — ABNORMAL LOW (ref 8–25)
CALCIUM: 9.2 mg/dL (ref 8.8–10.2)
CHLORIDE: 100 mmol/L (ref 96–111)
CO2 TOTAL: 28 mmol/L (ref 23–31)
CREATININE: 0.66 mg/dL — ABNORMAL LOW (ref 0.75–1.35)
ESTIMATED GFR: >90 mL/min/BSA (ref >=60)
GLUCOSE: 86 mg/dL (ref 65–125)
POTASSIUM: 3.3 mmol/L — ABNORMAL LOW (ref 3.5–5.1)
SODIUM: 134 mmol/L — ABNORMAL LOW (ref 136–145)

## 2021-03-30 LAB — CBC
HCT: 34.2 % — ABNORMAL LOW (ref 38.9–52.0)
HGB: 11 g/dL — ABNORMAL LOW (ref 13.4–17.5)
MCH: 26.3 pg (ref 26.0–32.0)
MCHC: 32.2 g/dL (ref 31.0–35.5)
MCV: 81.8 fL (ref 78.0–100.0)
MPV: 9.4 fL (ref 8.7–12.5)
PLATELETS: 451 10*3/uL — ABNORMAL HIGH (ref 150–400)
RBC: 4.18 10*6/uL — ABNORMAL LOW (ref 4.50–6.10)
RDW-CV: 16.2 % — ABNORMAL HIGH (ref 11.5–15.5)
WBC: 13.5 10*3/uL — ABNORMAL HIGH (ref 3.7–11.0)

## 2021-03-30 LAB — BRONCHOALVEOLAR LAVAGE (BAL) QUANTITATIVE CULTURE/GRAM STAIN: GRAM STAIN: NONE SEEN

## 2021-03-30 LAB — MAGNESIUM: MAGNESIUM: 1.7 mg/dL — ABNORMAL LOW (ref 1.8–2.6)

## 2021-03-30 LAB — HIV1/HIV2 SCREEN, COMBINED ANTIGEN AND ANTIBODY: HIV SCREEN, COMBINED ANTIGEN & ANTIBODY: NEGATIVE

## 2021-03-30 LAB — TISSUE CULTURE (AEROBIC CULT & GRAM STAIN): TISSUE CULTURE: NO GROWTH

## 2021-03-30 LAB — C-REACTIVE PROTEIN(CRP),INFLAMMATION: CRP INFLAMMATION: 129.9 mg/L — ABNORMAL HIGH (ref <8.0)

## 2021-03-30 MED ORDER — KETOROLAC 15 MG/ML INJECTION SOLUTION
15.0000 mg | Freq: Four times a day (QID) | INTRAMUSCULAR | Status: AC | PRN
Start: 2021-03-30 — End: 2021-04-02
  Administered 2021-03-30 – 2021-04-01 (×8): 15 mg via INTRAVENOUS
  Administered 2021-04-01: 0 mg via INTRAVENOUS
  Administered 2021-04-02: 15 mg via INTRAVENOUS
  Filled 2021-03-30 (×10): qty 1

## 2021-03-30 MED ORDER — POTASSIUM BICARBONATE-CITRIC ACID 20 MEQ EFFERVESCENT TABLET
40.0000 meq | EFFERVESCENT_TABLET | Freq: Once | ORAL | Status: AC
Start: 2021-03-30 — End: 2021-03-30
  Administered 2021-03-30: 40 meq via ORAL
  Filled 2021-03-30: qty 2

## 2021-03-30 MED ORDER — MAGNESIUM SULFATE 4 GRAM/100 ML (4 %) IN WATER INTRAVENOUS PIGGYBACK
4.0000 g | INJECTION | INTRAVENOUS | Status: AC
Start: 2021-03-30 — End: 2021-03-30
  Administered 2021-03-30: 0 g via INTRAVENOUS
  Administered 2021-03-30: 4 g via INTRAVENOUS
  Filled 2021-03-30: qty 100

## 2021-03-30 MED ORDER — LOPERAMIDE 2 MG CAPSULE
2.0000 mg | ORAL_CAPSULE | ORAL | Status: DC | PRN
Start: 2021-03-30 — End: 2021-04-02
  Administered 2021-03-30 – 2021-04-02 (×6): 2 mg via ORAL
  Filled 2021-03-30 (×7): qty 1

## 2021-03-30 MED ORDER — ARFORMOTEROL 15 MCG/2 ML SOLUTION FOR NEBULIZATION
INHALATION_SOLUTION | RESPIRATORY_TRACT | Status: AC
Start: 2021-03-30 — End: 2021-03-30
  Filled 2021-03-30: qty 1

## 2021-03-30 NOTE — Progress Notes (Signed)
[facility]  Medicine Progress Note    Alexander Richard  Date of service: 03/30/2021  Date of Admission:  03/22/2021    Hospital Day:  LOS: 8 days   Subjective: Patient was seen and examined at bedside. There were no new issues overnight. C/o right sided pain with coughing but is somewhat improved. Loose stools have slowed down a good bit after holding senokot. Denies fevers, chills, CP, SOB, abd pain, and Nausea.      Vital Signs:  Temp  Avg: 36.8 C (98.2 F)  Min: 36.6 C (97.9 F)  Max: 37 C (98.6 F)    Pulse  Avg: 87.8  Min: 77  Max: 110 BP  Min: 110/74  Max: 129/81   Resp  Avg: 16.5  Min: 16  Max: 18 SpO2  Avg: 94.9 %  Min: 92 %  Max: 98 %          Input/Output    Intake/Output Summary (Last 24 hours) at 03/30/2021 1206  Last data filed at 03/30/2021 1134  Gross per 24 hour   Intake 520 ml   Output 2350 ml   Net -1830 ml    I/O last shift:  03/27 0700 - 03/27 1859  In: 220 [P.O.:120]  Out: 550 [Urine:550]   acetaminophen (TYLENOL) tablet, 650 mg, Oral, Q4H PRN  albuterol (PROVENTIL) 2.5mg / 0.5 mL nebulizer solution, 2.5 mg, Nebulization, Q4H PRN  arformoterol (BROVANA) 15 mcg/2 mL nebulizer solution, 15 mcg, Nebulization, 2x/day  D5W 250 mL flush bag, , Intravenous, Q15 Min PRN  D5W 250 mL flush bag, , Intravenous, Q15 Min PRN  DULoxetine (CYMBALTA) delayed release capsule, 60 mg, Oral, Daily  enoxaparin PF (LOVENOX) 40 mg/0.4 mL SubQ injection, 40 mg, Subcutaneous, Daily  gabapentin (NEURONTIN) capsule, 600 mg, Oral, 3x/day  ketorolac (TORADOL) 15 mg/mL injection, 15 mg, Intravenous, Q6H PRN  lidocaine-menthol (LIDOPATCH) 3.6%-1.25% patch, 1 Patch, Transdermal, Daily  methocarbamol (ROBAXIN) tablet, 750 mg, Oral, 4x/day  morphine 2 mg/mL injection, 2 mg, Intravenous, Q4H PRN  nicotine (NICODERM CQ) transdermal patch (mg/24 hr), 21 mg, Transdermal, Daily  NS 250 mL flush bag, , Intravenous, Q15 Min PRN  NS 250 mL flush bag, , Intravenous, Q15 Min PRN  NS flush syringe, 2-6 mL, Intracatheter,  Q8HRS  NS flush syringe, 2-6 mL, Intracatheter, Q1 MIN PRN  NS flush syringe, 2-6 mL, Intracatheter, Q8HRS  NS flush syringe, 2-6 mL, Intracatheter, Q1 MIN PRN  ondansetron (ZOFRAN) 2 mg/mL injection, 4 mg, Intravenous, Q8H PRN  piperacillin-tazobactam (ZOSYN) 4.5 g in NS 100 mL IVPB, 4.5 g, Intravenous, Q8H  [Held by provider] sennosides-docusate sodium (SENOKOT-S) 8.6-50mg  per tablet, 1 Tablet, Oral, 2x/day  tamsulosin (FLOMAX) capsule, 0.4 mg, Oral, Daily after Dinner  tiotropium bromide (SPIRIVA RESPIMAT) 2.5 mcg per inhalation oral inhaler - "Nursing to administer", 2 Puff, Inhalation, Daily  traMADol (ULTRAM) tablet, 50 mg, Oral, Q6H PRN        Physical Exam:  GENERAL: Pleasant, No acute distress  HEENT: Normocephalic, atraumatic  NECK: Supple, trachea midline  PULM: CTA bilaterally  CV: RRR, systolic murmur  GI: abd soft, nontender, nondistended, normoactive BS  EXTREMITIES: no peripheral edema, pulses intact  NEURO: Cranial nerves 2-12 grossly intact  PSYCH: Alert and oriented x 3      Labs:  CBC Results Differential Results   No results found for this or any previous visit (from the past 30 hour(s)). No results found for this or any previous visit (from the past 30 hour(s)).   BMP Results  Other Chemistries Results   Results for orders placed or performed during the hospital encounter of 03/22/21 (from the past 30 hour(s))   BASIC METABOLIC PANEL - AM ONCE    Collection Time: 03/30/21  4:30 AM   Result Value    SODIUM 134 (L)    POTASSIUM 3.3 (L)    CHLORIDE 100    CO2 TOTAL 28    GLUCOSE 86    BUN 6 (L)    CREATININE 0.66 (L)    Recent Results (from the past 30 hour(s))   MAGNESIUM - AM ONCE    Collection Time: 03/30/21  4:30 AM   Result Value    MAGNESIUM 1.7 (L)      Liver/Pancreas Enzyme Results Liver Function Results   No results found for this or any previous visit (from the past 30 hour(s)). No results found for this or any previous visit (from the past 30 hour(s)).   Cardiac Results Coags Results    No results found for this or any previous visit (from the past 30 hour(s)). No results found for this or any previous visit (from the past 30 hour(s)).     Radiology:     No new imaging    PT/OT: Yes    Consults: Pulm      Assessment/ Plan:   Active Hospital Problems    Diagnosis   . Primary Problem: Pneumonia   . 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 Lung Mass vs Pneumonia (cannot rule-out gram negative) vs Abscess  Sepsis (resolved)  COPD exacerbation  - Remains on 2L O2 this AM. Does not wear O2 at home  - CT imaging with consolidation and cavitation at RML and towards lower lose concerning for necrotizing infection  - MRSA negative, sputum culture with oral flora, strep & legionella Ag negative, Biofire viral assay negative  - blood cultures (3/20) NGTD  - Pulmonary on board. S/p bronchoscopy/EBUS 3/24. Pus noted around lesion of concern. Possible abscess. Cultures NGTD, however patient had gotten days of abx prior to procedure. Path pending.  - Will consult ID  - Duoneb QID and albuterol nebs PRN  - continue Brovana nebulizer BID and Spiriva daily (added this admission)  - continue IV Zosyn for now (start: 3/20).   - aggressive pulmonary toiletry, mobilization, P&V, incentive spirometry  - Tylenol, Tramadol, lidopatch for pain relief    Hyponatremia, mild  - blood osm 289 (WNL) so unclear if true hyponatremia or mild SIADH (related to lung pathology)  - recheck BMP in am    Hypomagnesemia  - resolved  - recheck mag in am    Nicotine Dependence  - (3/20) counseled 7 minutes on smoking cessation by previous attending  - continue NRT with 21 mg transdermal patch daily    Chronic Pain  Hx of Spinal Stenosis  - continue Neurontin 600 mg TID  - continue Robaxin 750 mg (increased to QID)  - continue Cymbalta 60 mg daily    DVT/PE Prophylaxis:  Enoxaparin    Disposition Planning: Home discharge      On the day of the encounter, a total of  42 minutes was spent on this patient encounter including review of historical information, education of current ailment to patient and/or family members, examination, documentation, and post-visit activities.     Diana Eves, DO  Hospitalist  03/30/2021

## 2021-03-30 NOTE — Care Management Notes (Signed)
[facility]  Care Management Note    Patient Name: Alexander Richard  Date of Birth: [birth year]  Sex: male  Date/Time of Admission: 03/22/2021  4:29 PM  Room/Bed: 958/A  Payor: [employer] / Plan: Everlean Cherry [employer]AC[employer]/OPTUM / Product Type: Managed Care /    LOS: 8 days   Primary Care Providers:  Gwenith Daily, MD, MD (General)    Admitting Diagnosis:  Sepsis (CMS [facility]) [A41.9]    Assessment:      03/30/21 1401   Assessment Details   Assessment Type Continued Assessment   Date of Care Management Update 03/30/21   Date of Next DCP Update 04/02/21   Care Management Plan   Discharge Planning Status plan in progress   Projected Discharge Date 04/01/21   CM will evaluate for rehabilitation potential yes   Patient choice offered to patient/family no   Discharge Needs Assessment   Discharge Facility/Level of Care Needs Home (Patient/Family Member/other)(code 1)       Per chart, pt not medically ready for d/c today.  Pt remains on 2L O2, but does not have home O2.  CM will follow for any d/c needs.      Discharge Plan:  Home (Patient/Family Member/other) (code 1)      The patient will continue to be evaluated for developing discharge needs.     Case Manager: Annabell Howells, SOCIAL WORKER  Phone: [phone]

## 2021-03-30 NOTE — Consults (Signed)
INFECTIOUS DISEASE INITIAL CONSULTATION    Patient Name: Alexander Richard Number: [id number]  Date of Service: 03/30/2021  Date of Birth: 11/10/1960    Richard Day:  LOS: 8 days     Requesting Service: HOSPITALIST 5  Requesting Physician: Girard Cooter, DO    Impression:  [name] is a 61 y.o., White male with history of who chronic back pain, GERD, and tobacco abuse who was transferred from [place] on 03/192023 for acute hypoxic respiratory failure. The patient has had ongoing symptoms of cough (with hemoptysis), worsening shortness of breath, and night sweats for about 4 months. He completed two courses of oral antibiotics with temporary improvement in his symptoms. CT shows regions of masslike consolidation in the right middle and lower lobes and regions of focal internal air suggesting underlying necrosis or developing abscess. He is now s/p EBUS on 03/27/2021 where gross purulence was noted. Pathology is pending but BAL cultures are no growth to date which is not all that surprising given that he was on antibiotics for several days before the procedure was done. At this point differential includes bacterial, MRSA swab was negative so less likely MRSA. Patient did have some improvement on oral antibiotics and course may not have been adequate. Fungal is also a possibility with the more indolent course, including Aspergillus which commonly causes hemoptysis. TB less likely but given hemoptysis and night sweats, still a consideration though less likely with negative AFB smears.      Recommendations:  -continue Zosyn 4.5g q8h  -check urine histoplasma and blastomyces  -screen for HIV  -check serum cryptococcus antigen  -check Quantiferon  -please ask Microbiology if they can add MTB PCR to patient's BAL     Please continue to follow CBC with differential, BUN, creatinine, hepatic function panel, and CRP while the patient remains on antimicrobial therapy.    Thank you for this consultation.  We  will continue to follow.  Please call or page the Infectious Diseases Service with any questions regarding this patient.    Information Source: Patient and electronic medical record    Reason for Consultation: lung abscess     History of Present Illness:  Alexander Richard is a 61 y.o., White male with history of who chronic back pain, GERD, and tobacco abuse who was transferred from [place] on 03/192023 for acute hypoxic respiratory failure. He presented there with fever, shortness of breath, and cough with hemoptysis for about 4 days prior to presentation. He was hypoxic on presentation.     The patient reports that about 4 months ago, around the end of October, he initially developed a nonproductive cough but then started having hemoptysis with bright red blood. He He then went to the [place] and had a CT chest which showed a right lung mass/infection and he was given 7 days of oral antibiotics. He notes he initially started to feel better with the antibiotics but about a week after finishing them, his symptoms began to worsen. He then got readmitted to the ICU at the [facility] and had a bronchoscopy with BAL which was reportedly negative for malignancy. He was discharged with oral antibiotics for 5-7 days. He again got better for about a week or so but then symptoms worsened again. He started to be come very short of breath to the point he could barely walk around. He therefore returned to the [place] on 03/19. He admits to low grade fevers with temperature 99-100 but did go as  high as 101 once. He also admits to drenching night sweats. He denies any weight loss, nausea, diarrhea, skin rash or lesions. New imagining on admission to the [place] showed cavitary mass-like consolidation in the central superior right lower lobe concerning for cavitary pneumonia vs abscess vs neoplasm. The patient was transferred for higher level of care and admitted to MICU at [facility]..       On presentation to [facility],  WBC count was elevated at 29.9 which has improved down to 13.5 today. Respiratory culture, blood cultures, respiratory BioFire, MRSA nasal swab, S pneumoniae and legionella urine antigens were all negative. CT chest showed regions of masslike consolidation in the right middle and lower lobes and regions of focal internal air suggesting underlying necrosis or developing abscess. EBUS was done on 03/27/2021 where a large amount of pus like secretions was encountered in the right mainstem and right-sided segments.  Cultures are negative to date. Pathology is still pending. He is currently on Zosyn.     Past Medical History:  Past Medical History:   Diagnosis Date    Chronic back pain     Tobacco abuse      Past Surgical History:  Past Surgical History:   Procedure Laterality Date    ELECTRO MAGNETIC NAVIGATION BRONCHOSCOPY N/A 03/27/2021    Performed by Suann Larry, [facility] at [facility]      x2 in 1987. No hardware.    ULTRASOUND ENDOBRONCHIAL N/A 03/27/2021    Performed by Suann Larry, MD at [place]       Family History:  Family Medical History:    None         Allergies:  No Known Allergies    Medications:  Medications Prior to Admission       Prescriptions    albuterol sulfate (PROVENTIL OR VENTOLIN OR PROAIR) 90 mcg/actuation Inhalation oral inhaler    Take 2 Puffs by inhalation Four times a day as needed    DULoxetine (CYMBALTA DR) 60 mg Oral Capsule, Delayed Release(E.C.)    Take 1 Capsule (60 mg total) by mouth Once a day    gabapentin (NEURONTIN) 600 mg Oral Tablet    Take 1 Tablet (600 mg total) by mouth Three times a day    methocarbamoL (ROBAXIN) 750 mg Oral Tablet    Take 1 Tablet (750 mg total) by mouth Twice daily    tamsulosin (FLOMAX) 0.4 mg Oral Capsule    Take 1 Capsule (0.4 mg total) by mouth Every evening after dinner    tiotropium-olodateroL (STIOLTO RESPIMAT) 2.5-2.5 mcg/actuation Inhalation Mist    Take 2 INHALATION by inhalation Once a day               Inpatient Medications:  acetaminophen (TYLENOL) tablet, 650 mg, Oral, Q4H PRN  albuterol (PROVENTIL) 2.73m/ 0.5 mL nebulizer solution, 2.5 mg, Nebulization, Q4H PRN  arformoterol (BROVANA) 15 mcg/2 mL nebulizer solution, 15 mcg, Nebulization, 2x/day  D5W 250 mL flush bag, , Intravenous, Q15 Min PRN  D5W 250 mL flush bag, , Intravenous, Q15 Min PRN  DULoxetine (CYMBALTA) delayed release capsule, 60 mg, Oral, Daily  enoxaparin PF (LOVENOX) 40 mg/0.4 mL SubQ injection, 40 mg, Subcutaneous, Daily  gabapentin (NEURONTIN) capsule, 600 mg, Oral, 3x/day  ketorolac (TORADOL) 15 mg/mL injection, 15 mg, Intravenous, Q6H PRN  lidocaine-menthol (LIDOPATCH) 3.6%-1.25% patch, 1 Patch, Transdermal, Daily  methocarbamol (ROBAXIN) tablet, 750 mg, Oral, 4x/day  morphine  2 mg/mL injection, 2 mg, Intravenous, Q4H PRN  nicotine (NICODERM CQ) transdermal patch (mg/24 hr), 21 mg, Transdermal, Daily  NS 250 mL flush bag, , Intravenous, Q15 Min PRN  NS 250 mL flush bag, , Intravenous, Q15 Min PRN  NS flush syringe, 2-6 mL, Intracatheter, Q8HRS  NS flush syringe, 2-6 mL, Intracatheter, Q1 MIN PRN  NS flush syringe, 2-6 mL, Intracatheter, Q8HRS  NS flush syringe, 2-6 mL, Intracatheter, Q1 MIN PRN  ondansetron (ZOFRAN) 2 mg/mL injection, 4 mg, Intravenous, Q8H PRN  piperacillin-tazobactam (ZOSYN) 4.5 g in NS 100 mL IVPB, 4.5 g, Intravenous, Q8H  [Held by provider] sennosides-docusate sodium (SENOKOT-S) 8.6-50mg per tablet, 1 Tablet, Oral, 2x/day  tamsulosin (FLOMAX) capsule, 0.4 mg, Oral, Daily after Dinner  tiotropium bromide (SPIRIVA RESPIMAT) 2.5 mcg per inhalation oral inhaler - "Nursing to administer", 2 Puff, Inhalation, Daily  traMADol (ULTRAM) tablet, 50 mg, Oral, Q6H PRN        Current Antimicrobials:  Antibiotics (From admission, onward)      Start     Stop Route Frequency    03/23/21 0000  piperacillin-tazobactam (ZOSYN) 4.5 g in NS 100 mL IVPB  (piperacillin-tazobactam (ZOSYN) IVPB load & maintenance dose)         -- IV  EVERY 8 HOURS    03/22/21 2000  vancomycin (VANCOCIN) 1,250 mg in NS 250 mL IVPB  (vancomycin IVPB - load & maintenance dose)  Status:  Discontinued         03/20 1021 IV EVERY 12 HOURS    03/22/21 1800  piperacillin-tazobactam (ZOSYN) 4.5 g in NS 100 mL IVPB  (piperacillin-tazobactam (ZOSYN) IVPB load & maintenance dose)         03/19 1820 IV NOW             Lines:  Patient Lines/Drains/Airways Status       Active Line / Dialysis Catheter / Dialysis Graft / Drain / Airway / Wound       Name Placement date Placement time Site Days    Midline Single Lumen Left 03/22/21  1639  --  8                     Social History:  Place of residence: [place], [place].    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 He has had multiple negative PPDs. Has spent a couple nights in jail. No history of homelessness.     Review of Systems:  Constitutional: Negative for fevers, chills, weight loss. +Sweats, fatigue.  Eyes: Negative for acute visual disturbance, photophobia, and redness.  Ears, nose, mouth, throat,  and face: Negative for sinus congestion, odynophagia, and throat soreness.  Respiratory: + shortness of breath and  cough.  Cardiovascular: Negative for chest pain, chest pressure, and bilateral lower extremity edema.  Gastrointestinal: Negative for abdominal pain, nausea, emesis, diarrhea, and bloody stools.  Genitourinary: Negative for dysuria and hematuria.  Integument: Negative for rash and pruritis.  Hematologic and lymphatic: Negative for easy bruising and lymphadenopathy.  Musculoskeletal: Negative for myalgias and arthralgias.  Neurological: Negative for headaches, dizziness, and seizures.  Behavioral/Psychiatric: Negative for anxiety, depression, and substance use.  Endocrine: Negative for diabetes and thyroid disease.    Physical Exam:  Current Vitals:   BP 137/82   Pulse 76   Temp 36.6 C (97.9 F)   Resp 18   Ht 1.803 m (_0 )   Wt 98.6 kg (217 lb 6 oz)   SpO2 95%   BMI 30.32 kg/m        Vitals in last 24 hours: Temp  Avg: 36.7 C (98 F)  Min: 36.5 C (97.7 F)  Max: 36.8 C (98.2 F)  MAP (Non-Invasive)  Avg: 92.2 mmHG  Min: 86 mmHG  Max: 97 mmHG  Pulse  Avg: 86.8  Min: 76  Max: 110  Resp  Avg: 16.5  Min: 16  Max: 18  SpO2  Avg: 95.7 %  Min: 92 %  Max: 98 %     General: No acute distress.  Non-toxic appearing.  Eyes: Pupils equal round and reactive bilaterally. No conjunctival injection or scleral icterus.  ENT: Face symmetrical. Mucous membranes pink and moist. No oral candidiasis or mucosal ulcers.  Neck: Supple and symmetrical.    Lungs: Clear to auscultation bilaterally.  No wheezes, rhonchi, or crackles.  Unlabored respirations. Diminished on the right.   Cardiovascular: Regular rate and rhythm. No murmur.  Abdomen: Normoactive bowel sounds. Nondistended, soft, and nontender to palpation.    Extremities: No joint erythema or swelling. No cyanosis or edema.  Skin: Warm and dry. No rash or lesions.  Neurologic: Alert and oriented X 3. Cranial nerves II-XII are intact. Moves all extremities without focal deficit.  Psychiatric: Appropriate affect and behavior. Intact memory. Fluent speech.     Laboratory  Studies:    CBC Differential   Recent Labs     03/28/21  0448 03/29/21  0242 03/30/21  0430   WBC 12.3* 12.6* 13.5*   HGB 10.3* 9.9* 11.0*   HCT 31.9* 31.1* 34.2*   PLTCNT 346 389 451*    Recent Labs     03/29/21  0242   PMNS 84   LYMPHOCYTES 9   MONOCYTES 5   EOSINOPHIL 1   BASOPHILS 0  <0.10   PMNABS 10.58*   MONOSABS 0.63   EOSABS 0.13      BMP LFTs   Recent Labs     03/30/21  0430   SODIUM 134*   POTASSIUM 3.3*   CHLORIDE 100   CO2 28   BUN 6*   CREATININE 0.66*   GLUCOSENF 86   ANIONGAP 6   BUNCRRATIO 9   GFR >90   CALCIUM 9.2   MAGNESIUM 1.7*    No results found for this encounter   CoAgs Blood Gas:   No results found for this encounter No results found for this encounter    Cardiac Markers Lipid Panel   No results for input(s): TROPONINI, CKMB, MBINDEX, BNP in the last 72 hours. No results found for this encounter   Urine Analysis Other Labs   No  results found for this encounter Recent Labs     03/30/21  0430   CREAPROINFLA 129.9*          Microbiology:  Richard Encounter on 03/22/21 (from the past 96 hour(s))   BRONCHOALVEOLAR LAVAGE (BAL) QUANTITATIVE CULTURE/GRAM STAIN    Collection Time: 03/27/21  4:26 PM    Specimen: Bronchoalveolar Lavage; Other   Culture Result Status    BRONCHOALVEOLAR LAVAGE (BAL) QUANTITATIVE CULTURE 200 CFU/mL Normal Oral Flora Final    GRAM STAIN 4+ Many PMNs Final    GRAM STAIN No Organisms Seen Final   FUNGUS CULTURE    Collection Time: 03/27/21  4:26 PM    Specimen: Bronchoalveolar Lavage; Other   Culture Result Status    FUNGAL CULTURE No Growth 2 Days Preliminary   AFB CULTURE WITH STAIN    Collection Time: 03/27/21  4:26 PM    Specimen: Bronchoalveolar Lavage; Other   Culture Result Status    AFB SMEAR Auramine concentrated smear:  Negative Preliminary   KOH PREP    Collection Time: 03/27/21  4:26 PM    Specimen: Bronchoalveolar Lavage; Other   Culture Result Status    KOH PREP No yeast or hyphal elements seen. Final   BRONCHOALVEOLAR LAVAGE (BAL) QUANTITATIVE CULTURE/GRAM  STAIN    Collection Time: 03/27/21  4:29 PM    Specimen: Bronchoalveolar Lavage; Other   Culture Result Status    BRONCHOALVEOLAR LAVAGE (BAL) QUANTITATIVE CULTURE No Growth 3 Days Final    GRAM STAIN 4+ Many PMNs Final    GRAM STAIN No Organisms Seen Final   FUNGUS CULTURE    Collection Time: 03/27/21  4:29 PM    Specimen: Bronchoalveolar Lavage; Other   Culture Result Status    FUNGAL CULTURE No Growth 2 Days Preliminary   AFB CULTURE WITH STAIN    Collection Time: 03/27/21  4:29 PM    Specimen: Bronchoalveolar Lavage; Other   Culture Result Status    AFB SMEAR Auramine concentrated smear:  Negative Preliminary   TISSUE CULTURE (AEROBIC CULT & GRAM STAIN)    Collection Time: 03/27/21  4:33 PM    Specimen: Tissue   Culture Result Status    TISSUE CULTURE No Growth 3 Days Preliminary    GRAM STAIN 3+ Several PMNs Preliminary    GRAM STAIN No Organisms Seen Preliminary   FUNGUS CULTURE    Collection Time: 03/27/21  4:33 PM    Specimen: Tissue; Other   Culture Result Status    FUNGAL CULTURE No Growth 2 Days Preliminary   AFB CULTURE WITH STAIN    Collection Time: 03/27/21  4:33 PM    Specimen: Tissue; Other   Culture Result Status    AFB SMEAR Auramine concentrated smear:  Negative Preliminary   TISSUE CULTURE (AEROBIC CULT & GRAM STAIN)    Collection Time: 03/27/21  4:36 PM    Specimen: Tissue   Culture Result Status    TISSUE CULTURE No Growth 3 Days Preliminary    GRAM STAIN 1+ Rare PMNs Preliminary    GRAM STAIN No Organisms Seen Preliminary   FUNGUS CULTURE    Collection Time: 03/27/21  4:36 PM    Specimen: Tissue; Other   Culture Result Status    FUNGAL CULTURE No Growth 2 Days Preliminary   AFB CULTURE WITH STAIN    Collection Time: 03/27/21  4:36 PM    Specimen: Tissue; Other   Culture Result Status    AFB SMEAR Auramine concentrated smear:  Negative Preliminary  Georgian Co, DO PGY-4  Infectious Disease  Pager [phone]    I saw and examined the patient. I reviewed the fellow's note. I agree with the  findings and plan of care as documented in the fellow's note. Any exceptions/additions are edited/noted.    On the day of the encounter, a total of 35 minutes was spent on this patient encounter including review of historical information, examination, documentation and post-visit activities. The time documented excludes procedural time.    Ortencia Kick, MD  Assistant Professor of Medicine  Infectious Diseases, Transplant

## 2021-03-30 NOTE — Nurses Notes (Signed)
Paged hospitalist: M. Newey [phone] c/o of diahrea. Pt requesting imodium. [place] Dec [phone]    Arloa Koh, LPN  08/13/9831, 20:34

## 2021-03-31 ENCOUNTER — Encounter (HOSPITAL_COMMUNITY): Payer: Self-pay | Admitting: Internal Medicine

## 2021-03-31 DIAGNOSIS — E871 Hypo-osmolality and hyponatremia: Secondary | ICD-10-CM

## 2021-03-31 DIAGNOSIS — B1920 Unspecified viral hepatitis C without hepatic coma: Secondary | ICD-10-CM

## 2021-03-31 DIAGNOSIS — R059 Cough, unspecified: Secondary | ICD-10-CM

## 2021-03-31 DIAGNOSIS — R846 Abnormal cytological findings in specimens from respiratory organs and thorax: Secondary | ICD-10-CM

## 2021-03-31 DIAGNOSIS — J441 Chronic obstructive pulmonary disease with (acute) exacerbation: Secondary | ICD-10-CM

## 2021-03-31 DIAGNOSIS — J9601 Acute respiratory failure with hypoxia: Secondary | ICD-10-CM

## 2021-03-31 DIAGNOSIS — R61 Generalized hyperhidrosis: Secondary | ICD-10-CM

## 2021-03-31 LAB — FUNGUS CULTURE: FUNGAL CULTURE: NO GROWTH

## 2021-03-31 LAB — CBC WITH DIFF
BASOPHIL #: <0.1 10*3/uL (ref <=0.20)
BASOPHIL %: 1 %
EOSINOPHIL #: 0.19 10*3/uL (ref <=0.50)
EOSINOPHIL %: 1 %
HCT: 31.2 % — ABNORMAL LOW (ref 38.9–52.0)
HGB: 10.2 g/dL — ABNORMAL LOW (ref 13.4–17.5)
IMMATURE GRANULOCYTE #: 0.25 10*3/uL — ABNORMAL HIGH (ref <0.10)
IMMATURE GRANULOCYTE %: 2 % — ABNORMAL HIGH (ref 0–1)
LYMPHOCYTE #: 1.38 10*3/uL (ref 1.00–4.80)
LYMPHOCYTE %: 10 %
MCH: 26.6 pg (ref 26.0–32.0)
MCHC: 32.7 g/dL (ref 31.0–35.5)
MCV: 81.3 fL (ref 78.0–100.0)
MONOCYTE #: 1.18 10*3/uL — ABNORMAL HIGH (ref 0.20–1.10)
MONOCYTE %: 9 %
MPV: 9 fL (ref 8.7–12.5)
NEUTROPHIL #: 10.7 10*3/uL — ABNORMAL HIGH (ref 1.50–7.70)
NEUTROPHIL %: 77 %
PLATELETS: 421 10*3/uL — ABNORMAL HIGH (ref 150–400)
RBC: 3.84 10*6/uL — ABNORMAL LOW (ref 4.50–6.10)
RDW-CV: 16.5 % — ABNORMAL HIGH (ref 11.5–15.5)
WBC: 13.8 10*3/uL — ABNORMAL HIGH (ref 3.7–11.0)

## 2021-03-31 LAB — GRAY TOP TUBE

## 2021-03-31 LAB — LIGHT GREEN TOP TUBE

## 2021-03-31 LAB — MAGNESIUM: MAGNESIUM: 1.9 mg/dL (ref 1.8–2.6)

## 2021-03-31 LAB — BILIRUBIN TOTAL: BILIRUBIN TOTAL: 0.4 mg/dL (ref 0.3–1.3)

## 2021-03-31 LAB — BASIC METABOLIC PANEL
ANION GAP: 7 mmol/L (ref 4–13)
BUN/CREA RATIO: 15 (ref 6–22)
BUN: 9 mg/dL (ref 8–25)
CALCIUM: 8.7 mg/dL — ABNORMAL LOW (ref 8.8–10.2)
CHLORIDE: 101 mmol/L (ref 96–111)
CO2 TOTAL: 26 mmol/L (ref 23–31)
CREATININE: 0.6 mg/dL — ABNORMAL LOW (ref 0.75–1.35)
ESTIMATED GFR: >90 mL/min/BSA (ref >=60)
GLUCOSE: 92 mg/dL (ref 65–125)
POTASSIUM: 3.7 mmol/L (ref 3.5–5.1)
SODIUM: 134 mmol/L — ABNORMAL LOW (ref 136–145)

## 2021-03-31 LAB — CYTOPATHOLOGY, NON GYN

## 2021-03-31 LAB — ALT (SGPT): ALT (SGPT): 10 U/L (ref 10–55)

## 2021-03-31 LAB — CRYPTOCOCCUS ANTIGEN SCREEN W/ TITER REFLEX, SERUM: CRYPTOCOCCUS ANTIGEN SCREEN, SERUM: NEGATIVE

## 2021-03-31 LAB — AST (SGOT): AST (SGOT): 11 U/L (ref 8–45)

## 2021-03-31 LAB — TISSUE CULTURE (AEROBIC CULT & GRAM STAIN)

## 2021-03-31 LAB — LAVENDER TOP TUBE

## 2021-03-31 LAB — MYCOBACTERIUM TUBERCULOSIS COMPLEX PCR - SPUTUM & BAL: MYCOBACTERIUM TUBERCULOSIS COMPLEX: NEGATIVE

## 2021-03-31 LAB — ALK PHOS (ALKALINE PHOSPHATASE): ALKALINE PHOSPHATASE: 47 U/L (ref 45–115)

## 2021-03-31 LAB — GOLD TOP TUBE

## 2021-03-31 LAB — C-REACTIVE PROTEIN(CRP),INFLAMMATION: CRP INFLAMMATION: 129 mg/L — ABNORMAL HIGH (ref <8.0)

## 2021-03-31 MED ORDER — ARFORMOTEROL 15 MCG/2 ML SOLUTION FOR NEBULIZATION
INHALATION_SOLUTION | RESPIRATORY_TRACT | Status: AC
Start: 2021-03-31 — End: 2021-03-31
  Filled 2021-03-31: qty 1

## 2021-03-31 NOTE — Progress Notes (Signed)
[facility]  Medicine Progress Note    Alexander Richard  Date of service: 03/31/2021  Date of Admission:  03/22/2021    Hospital Day:  LOS: 9 days   Subjective: Patient was seen and examined at bedside. There were no new issues overnight. Denies fevers, chills, CP, SOB, abd pain, and Nausea.      Vital Signs:  Temp  Avg: 37 C (98.6 F)  Min: 36.6 C (97.9 F)  Max: 37.3 C (99.1 F)    Pulse  Avg: 85.5  Min: 72  Max: 97 BP  Min: 114/74  Max: 136/93   Resp  Avg: 16.3  Min: 16  Max: 18 SpO2  Avg: 94.5 %  Min: 90 %  Max: 97 %          Input/Output    Intake/Output Summary (Last 24 hours) at 03/31/2021 1640  Last data filed at 03/31/2021 1524  Gross per 24 hour   Intake 840 ml   Output 1100 ml   Net -260 ml    I/O last shift:  03/28 0700 - 03/28 1859  In: 600 [P.O.:300]  Out: 850 [Urine:850]   acetaminophen (TYLENOL) tablet, 650 mg, Oral, Q4H PRN  albuterol (PROVENTIL) 2.51m/ 0.5 mL nebulizer solution, 2.5 mg, Nebulization, Q4H PRN  arformoterol (BROVANA) 15 mcg/2 mL nebulizer solution, 15 mcg, Nebulization, 2x/day  D5W 250 mL flush bag, , Intravenous, Q15 Min PRN  D5W 250 mL flush bag, , Intravenous, Q15 Min PRN  DULoxetine (CYMBALTA) delayed release capsule, 60 mg, Oral, Daily  enoxaparin PF (LOVENOX) 40 mg/0.4 mL SubQ injection, 40 mg, Subcutaneous, Daily  gabapentin (NEURONTIN) capsule, 600 mg, Oral, 3x/day  ketorolac (TORADOL) 15 mg/mL injection, 15 mg, Intravenous, Q6H PRN  lidocaine-menthol (LIDOPATCH) 3.6%-1.25% patch, 1 Patch, Transdermal, Daily  loperamide (IMODIUM) capsule, 2 mg, Oral, Q4H PRN  methocarbamol (ROBAXIN) tablet, 750 mg, Oral, 4x/day  morphine 2 mg/mL injection, 2 mg, Intravenous, Q4H PRN  nicotine (NICODERM CQ) transdermal patch (mg/24 hr), 21 mg, Transdermal, Daily  NS 250 mL flush bag, , Intravenous, Q15 Min PRN  NS 250 mL flush bag, , Intravenous, Q15 Min PRN  NS flush syringe, 2-6 mL, Intracatheter, Q8HRS  NS flush syringe, 2-6 mL, Intracatheter, Q1 MIN PRN  NS flush syringe, 2-6  mL, Intracatheter, Q8HRS  NS flush syringe, 2-6 mL, Intracatheter, Q1 MIN PRN  ondansetron (ZOFRAN) 2 mg/mL injection, 4 mg, Intravenous, Q8H PRN  piperacillin-tazobactam (ZOSYN) 4.5 g in NS 100 mL IVPB, 4.5 g, Intravenous, Q8H  [Held by provider] sennosides-docusate sodium (SENOKOT-S) 8.6-50mg per tablet, 1 Tablet, Oral, 2x/day  tamsulosin (FLOMAX) capsule, 0.4 mg, Oral, Daily after Dinner  tiotropium bromide (SPIRIVA RESPIMAT) 2.5 mcg per inhalation oral inhaler - "Nursing to administer", 2 Puff, Inhalation, Daily  traMADol (ULTRAM) tablet, 50 mg, Oral, Q6H PRN        Physical Exam:  GENERAL: Pleasant, No acute distress  HEENT: Normocephalic, atraumatic  NECK: Supple, trachea midline  PULM: CTA bilaterally  CV: RRR, systolic murmur  GI: abd soft, nontender, nondistended, normoactive BS  EXTREMITIES: no peripheral edema, pulses intact  NEURO: Cranial nerves 2-12 grossly intact  PSYCH: Alert and oriented x 3      Labs:  CBC Results Differential Results   Recent Results (from the past 30 hour(s))   CBC WITH DIFF    Collection Time: 03/31/21  4:33 AM   Result Value    WBC 13.8 (H)    HGB 10.2 (L)    HCT 31.2 (L)  PLATELETS 421 (H)    Recent Results (from the past 30 hour(s))   CBC WITH DIFF    Collection Time: 03/31/21  4:33 AM   Result Value    WBC 13.8 (H)    NEUTROPHIL % 77    MONOCYTE % 9    BASOPHIL % 1    BASOPHIL # <0.10      BMP Results Other Chemistries Results   Results for orders placed or performed during the hospital encounter of 03/22/21 (from the past 30 hour(s))   BASIC METABOLIC PANEL - AM ONCE    Collection Time: 03/31/21  4:33 AM   Result Value    SODIUM 134 (L)    POTASSIUM 3.7    CHLORIDE 101    CO2 TOTAL 26    GLUCOSE 92    BUN 9    CREATININE 0.60 (L)    Recent Results (from the past 30 hour(s))   MAGNESIUM - AM ONCE    Collection Time: 03/31/21  4:33 AM   Result Value    MAGNESIUM 1.9      Liver/Pancreas Enzyme Results Liver Function Results   Recent Results (from the past 30 hour(s))   ALK  PHOS (ALKALINE PHOSPHATASE)    Collection Time: 03/31/21  4:33 AM   Result Value    ALKALINE PHOSPHATASE 47   AST (SGOT)    Collection Time: 03/31/21  4:33 AM   Result Value    AST (SGOT)  11   ALT (SGPT)    Collection Time: 03/31/21  4:33 AM   Result Value    ALT (SGPT) 10    Recent Results (from the past 30 hour(s))   BILIRUBIN TOTAL    Collection Time: 03/31/21  4:33 AM   Result Value    BILIRUBIN TOTAL 0.4      Cardiac Results Coags Results   No results found for this or any previous visit (from the past 30 hour(s)). No results found for this or any previous visit (from the past 30 hour(s)).     Radiology:     No new imaging    PT/OT: Yes    Consults: Pulm, ID      Assessment/ Plan:   Active Hospital Problems    Diagnosis   . Primary Problem: Pneumonia   . 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 Lung Mass vs Pneumonia (cannot rule-out gram negative) vs Abscess  Sepsis (resolved)  COPD exacerbation  - Remains on 2L O2 this AM. Does not wear O2 at home  - CT imaging with consolidation and cavitation at RML and towards lower lose concerning for necrotizing infection  - MRSA negative, sputum culture with oral flora, strep & legionella Ag negative, Biofire viral assay negative  - blood cultures (3/20) NGTD  - Pulmonary on board. S/p bronchoscopy/EBUS 3/24. Pus noted around lesion of concern. Possible abscess. Cultures NGTD, however patient had gotten days of abx prior to procedure. Path pending.  - ID on board. Continue Zosyn for now  - Duoneb QID and albuterol nebs PRN  - continue Brovana nebulizer BID and Spiriva daily (added this admission)  - continue IV Zosyn for now (start: 3/20).   - aggressive pulmonary toiletry, mobilization, P&V, incentive spirometry  - Tylenol, Tramadol, lidopatch for pain relief    Hyponatremia, mild  - blood  osm 289 (WNL) so unclear if  true hyponatremia or mild SIADH (related to lung pathology)  - recheck BMP in am    Hypomagnesemia  - resolved  - recheck mag in am    Nicotine Dependence  - (3/20) counseled 7 minutes on smoking cessation by previous attending  - continue NRT with 21 mg transdermal patch daily    Chronic Pain  Hx of Spinal Stenosis  - continue Neurontin 600 mg TID  - continue Robaxin 750 mg (increased to QID)  - continue Cymbalta 60 mg daily    DVT/PE Prophylaxis: Enoxaparin    Disposition Planning: Home discharge      On the day of the encounter, a total of  36 minutes was spent on this patient encounter including review of historical information, education of current ailment to patient and/or family members, examination, documentation, and post-visit activities.     Girard Cooter, DO  Hospitalist  03/31/2021

## 2021-03-31 NOTE — Care Plan (Signed)
[facility]  Occupational Therapy Progress Note    Patient Name: Alexander Richard  Date of Birth: July 06, 1960  Height:  180.3 cm (5' 11")  Weight:  98.8 kg (217 lb 13 oz)  Room/Bed: 958/A  [facility]or: [facility] / Plan: [facility]/[employer] / Product Type: Managed Care /     Assessment:    Pt tolerated tx session fairly well with limitations of increased nausea/fatigue, generalized weakness, balance deficits, and increased SOB during activity and ADL tasks. Pt ambulates with CGA, has some minor imbalances but is able to self correct - sometimes utilizes handheld supports (wall handrail, objects around room) to increase balance. Progressing well with IND in ADL tasks. Continue to recommend home with assist and home health.      Discharge Needs:   Equipment Recommendation: shower chair    The patient presents with mobility limitations due to impaired balance, impaired strength, and impaired functional activity tolerance that significantly impair/prevent patient's ability to participate in mobility-related activities of daily living (MRADLs) including  ambulation and transfers in order to safely complete, bathing. This functional mobility deficit can be sufficiently resolved with the use of a shower chair in order to decrease the risk of falls, morbidity, and mortality in performance of these MRADLs.  Patient is able to safely use this assistive device.    Discharge Disposition:  home with assist, home with home health   JUSTIFICATION OF DISCHARGE RECOMMENDATION   Based on current diagnosis, functional performance prior to admission, and current functional performance, this patient requires continued OT services in home with assist, home with home health in order to achieve significant functional improvements.    Plan:   Continue to follow patient according to established plan of care.  The risks/benefits of therapy have been discussed with the patient/caregiver and he/she is in  agreement with the established plan of care.     Subjective & Objective:      03/31/21 1124   Therapist Pager   OT Assigned/ Pager # devon [phone]/jordyn [phone]   Rehab Session   Document Type therapy progress note (daily note)   Total OT Minutes: 14   Patient Effort good   Patient Effort, Rehab Treatment Comment Pt agreeable to OT tx session   Symptoms Noted During/After Treatment fatigue;shortness of breath;nausea   General Information   General Observations of Patient Pt supine in bed upon arrival.   Medical Lines PIV Line   Respiratory Status nasal cannula  (2 L O2)   Existing Precautions/Restrictions fall precautions;full code   Pre Treatment Status   Pre Treatment Patient Status Patient supine in bed;Call light within reach;Telephone within reach;Nurse approved session   Support Present Pre Treatment  None   Communication Pre Treatment  Nurse   Mutuality/Individual Preferences   Individualized Care Needs OOB with CGA x1   Plan of Care Reviewed With patient   Vital Signs   Vitals Comment SpO2 with activity 88%, resat to mid 90s with seated rest break post-activity. On 2L supplemental O2 during rest and activity.   Pain Assessment   Pre/Posttreatment Pain Comment no formal c/o pain   Coping/Psychosocial Response Interventions   Plan Of Care Reviewed With patient   Cognitive Assessment/Interventions   Behavior/Mood Observations behavior appropriate to situation, WNL/WFL   Orientation Status oriented x 4   Attention WNL/WFL   Follows Commands WFL   Mobility Assessment/Training   Mobility Comment Supine-sit/sit-supine, sit-stand/stand-sit SBA. Ambulated 29' with no mobility device and CGA however  pt utilized wall handrail, mild imbalance noted as pt became more fatigued, was able to self correct and rebalance appropriately.   Bed Mobility Assessment/Treatment   Bed Mobility, Assistive Device Head of Bed Elevated   Supine-Sit [place] stand-by assistance   Sit to Supine, [place] stand-by assistance   Transfer  Assessment/Treatment   Sit-Stand [place] stand-by assistance   Stand-Sit [place] stand-by assistance   Sit-Stand-Sit, Assist Device None   ADL Assessment/Intervention   ADL Comments Reports walking to and from restroom to complete toileting tasks with IND. Reports bathing himself IND however experiences SOB and increased fatigue during task. Pt educated on use of shower chair for energy conservation management when completing bathing tasks. Donned pants sitting EOB and transitioned to standing to pull up to hips with SBA. Don socks sitting EOB with IND.   Balance Skill Training   Sitting Balance: Static good balance   Sitting, Dynamic (Balance) good balance   Sit-to-Stand Balance fair balance   Standing Balance: Static fair + balance   Standing Balance: Dynamic fair balance   Systems Impairment Contributing to Balance Disturbance musculoskeletal   Identified Impairments Contributing to Balance Disturbance decreased strength   Post Treatment Status   Post Treatment Patient Status Patient supine in bed;Call light within reach;Telephone within reach   Support Present Post Treatment  Nurse present   Communication Post Treatement Nurse   Clinical Impression   Functional Level at Time of Session Pt tolerated tx session fairly well with limitations of increased nausea/fatigue, generalized weakness, balance deficits, and increased SOB during activity and ADL tasks. Pt ambulates with CGA, has some minor imbalances but is able to self correct - sometimes utilizes handheld supports (wall handrail, objects around room) to increase balance. Progressing well with IND in ADL tasks. Continue to recommend home with assist and home health.   Criteria for Skilled Therapeutic Interventions Met (OT) yes;meets criteria   Rehab Potential good   Therapy Frequency minimum of 1x/week   Predicted Duration of Therapy until discharge   Anticipated Equipment Needs at Discharge shower chair   Anticipated Discharge Disposition home with  assist;home with home health   Daily Activity AM-PAC/6-clicks Score   Putting on/Taking off clothing on lower body 4   Bathing 3   Toileting 4   Putting on/Taking off clothing on upper body 4   Personal grooming 4   Eating Meals 4   Raw Score Total 23   Standardized (t-scale) Score 51.12   CMS 0-100% Score 15.86   CMS Modifier CI     OT Education: Reinforced role of OT at [facility], reviewed goals of session, energy conservation, falls prevention/home safety strategies, importance of engagement in upright/out of bed activity and engagement in ADL routine during hospital stay.    Therapist:   Jeanell Sparrow, OTS  Pager #: [phone]    I was present for the student's assessment/treatment of the patient. I agree with the findings and plan of care as documented in the student's note. Any exceptions/additions are edited. Thanks.  Kathryne Hitch, OTR/L  Pager #: [phone]

## 2021-03-31 NOTE — Consults (Signed)
INFECTIOUS DISEASE FOLLOW UP CONSULTATION    Patient Name: Alexander Richard Number: [id number]  Date of Service: 03/31/2021  Date of Birth: 1960-12-26    Richard Day:  LOS: 9 days     Reason for Consultation: lung abscess     Subjective:  No overnight events noted. The patient reports he continues to feel better. He has been having some diarrhea. He is down to 2L nasal cannula. He has been afebrile. No rashes or itching.     Objective:  Physical Exam:  Current Vitals: BP 117/75   Pulse 76   Temp 37 C (98.6 F)   Resp 16   Ht 1.803 m (_0 )   Wt 98.6 kg (217 lb 6 oz)   SpO2 96%   BMI 30.32 kg/m       Vitals in last 24 hours: Temp  Avg: 36.9 C (98.4 F)  Min: 36.5 C (97.7 F)  Max: 37.3 C (99.1 F)  MAP (Non-Invasive)  Avg: 94 mmHG  Min: 88 mmHG  Max: 103 mmHG  Pulse  Avg: 81.8  Min: 76  Max: 97  Resp  Avg: 16.5  Min: 16  Max: 18  SpO2  Avg: 96.3 %  Min: 95 %  Max: 97 %     General: No acute distress.  Non-toxic appearing.  Eyes: No conjunctival injection or scleral icterus.  ENT: Face symmetrical. No oral candidiasis or mucosal ulcers.  Neck: Supple and symmetrical.    Lungs: Clear to auscultation bilaterally. Unlabored respirations. Diminished on the right.   Cardiovascular: Regular rate and rhythm.  Abdomen: Normoactive bowel sounds. Nondistended, soft, and nontender to palpation.    Extremities: No joint erythema or swelling. No cyanosis or edema.  Skin: Warm and dry. No rash or lesions.  Neurologic: Alert and oriented X 3. Moves all extremities without focal deficit.  Psychiatric: Appropriate affect and behavior. Intact memory. Fluent speech.     Inpatient Medications:  acetaminophen (TYLENOL) tablet, 650 mg, Oral, Q4H PRN  albuterol (PROVENTIL) 2.60m/ 0.5 mL nebulizer solution, 2.5 mg, Nebulization, Q4H PRN  arformoterol (BROVANA) 15 mcg/2 mL nebulizer solution, 15 mcg, Nebulization, 2x/day  D5W 250 mL flush bag, , Intravenous, Q15 Min PRN  D5W 250 mL flush bag, , Intravenous, Q15 Min  PRN  DULoxetine (CYMBALTA) delayed release capsule, 60 mg, Oral, Daily  enoxaparin PF (LOVENOX) 40 mg/0.4 mL SubQ injection, 40 mg, Subcutaneous, Daily  gabapentin (NEURONTIN) capsule, 600 mg, Oral, 3x/day  ketorolac (TORADOL) 15 mg/mL injection, 15 mg, Intravenous, Q6H PRN  lidocaine-menthol (LIDOPATCH) 3.6%-1.25% patch, 1 Patch, Transdermal, Daily  loperamide (IMODIUM) capsule, 2 mg, Oral, Q4H PRN  methocarbamol (ROBAXIN) tablet, 750 mg, Oral, 4x/day  morphine 2 mg/mL injection, 2 mg, Intravenous, Q4H PRN  nicotine (NICODERM CQ) transdermal patch (mg/24 hr), 21 mg, Transdermal, Daily  NS 250 mL flush bag, , Intravenous, Q15 Min PRN  NS 250 mL flush bag, , Intravenous, Q15 Min PRN  NS flush syringe, 2-6 mL, Intracatheter, Q8HRS  NS flush syringe, 2-6 mL, Intracatheter, Q1 MIN PRN  NS flush syringe, 2-6 mL, Intracatheter, Q8HRS  NS flush syringe, 2-6 mL, Intracatheter, Q1 MIN PRN  ondansetron (ZOFRAN) 2 mg/mL injection, 4 mg, Intravenous, Q8H PRN  piperacillin-tazobactam (ZOSYN) 4.5 g in NS 100 mL IVPB, 4.5 g, Intravenous, Q8H  [Held by provider] sennosides-docusate sodium (SENOKOT-S) 8.6-50mg per tablet, 1 Tablet, Oral, 2x/day  tamsulosin (FLOMAX) capsule, 0.4 mg, Oral, Daily after Dinner  tiotropium bromide (SPIRIVA RESPIMAT) 2.5 mcg per inhalation oral inhaler - "  Nursing to administer", 2 Puff, Inhalation, Daily  traMADol (ULTRAM) tablet, 50 mg, Oral, Q6H PRN        Current Antimicrobials:  Antibiotics (From admission, onward)      Start     Stop Route Frequency    03/23/21 0000  piperacillin-tazobactam (ZOSYN) 4.5 g in NS 100 mL IVPB  (piperacillin-tazobactam (ZOSYN) IVPB load & maintenance dose)         -- IV EVERY 8 HOURS    03/22/21 2000  vancomycin (VANCOCIN) 1,250 mg in NS 250 mL IVPB  (vancomycin IVPB - load & maintenance dose)  Status:  Discontinued         03/20 1021 IV EVERY 12 HOURS    03/22/21 1800  piperacillin-tazobactam (ZOSYN) 4.5 g in NS 100 mL IVPB  (piperacillin-tazobactam (ZOSYN) IVPB load &  maintenance dose)         03/19 1820 IV NOW             Lines:  Patient Lines/Drains/Airways Status       Active Line / Dialysis Catheter / Dialysis Graft / Drain / Airway / Wound       Name Placement date Placement time Site Days    Midline Single Lumen Left 03/22/21  1639  --  8                     Laboratory Studies:  CBC Differential   Recent Labs     03/29/21  0242 03/30/21  0430 03/31/21  0433   WBC 12.6* 13.5* 13.8*   HGB 9.9* 11.0* 10.2*   HCT 31.1* 34.2* 31.2*   PLTCNT 389 451* 421*    Recent Labs     03/29/21  0242 03/31/21  0433   PMNS 84 77   LYMPHOCYTES 9  --    MONOCYTES 5 9   EOSINOPHIL 1  --    BASOPHILS 0  <0.10 1  <0.10   PMNABS 10.58* 10.70*   LYMPHSABS  --  1.38   MONOSABS 0.63 1.18*   EOSABS 0.13 0.19      BMP LFTs   Recent Labs     03/31/21  0433   SODIUM 134*   POTASSIUM 3.7   CHLORIDE 101   CO2 26   BUN 9   CREATININE 0.60*   GLUCOSENF 92   ANIONGAP 7   BUNCRRATIO 15   GFR >90   CALCIUM 8.7*   MAGNESIUM 1.9    Recent Labs     03/31/21  0433   TOTBILIRUBIN 0.4   AST 11   ALT 10   ALKPHOS 47      CoAgs Blood Gas:   No results found for this encounter No results found for this encounter    Cardiac Markers Lipid Panel   No results for input(s): TROPONINI, CKMB, MBINDEX, BNP in the last 72 hours. No results found for this encounter   Urine Analysis Other Labs   No results found for this encounter Recent Labs     03/31/21  [place] 129.0*        Microbiology:  Richard Encounter on 03/22/21 (from the past 96 hour(s))   BRONCHOALVEOLAR LAVAGE (BAL) QUANTITATIVE CULTURE/GRAM STAIN    Collection Time: 03/27/21  4:26 PM    Specimen: Bronchoalveolar Lavage; Other   Culture Result Status    BRONCHOALVEOLAR LAVAGE (BAL) QUANTITATIVE CULTURE 200 CFU/mL Normal Oral Flora Final    GRAM STAIN 4+ Many PMNs Final  GRAM STAIN No Organisms Seen Final   FUNGUS CULTURE    Collection Time: 03/27/21  4:26 PM    Specimen: Bronchoalveolar Lavage; Other   Culture Result Status    FUNGAL CULTURE No Growth  3 Days Preliminary   AFB CULTURE WITH STAIN    Collection Time: 03/27/21  4:26 PM    Specimen: Bronchoalveolar Lavage; Other   Culture Result Status    AFB SMEAR Auramine concentrated smear:  Negative Preliminary   KOH PREP    Collection Time: 03/27/21  4:26 PM    Specimen: Bronchoalveolar Lavage; Other   Culture Result Status    KOH PREP No yeast or hyphal elements seen. Final   BRONCHOALVEOLAR LAVAGE (BAL) QUANTITATIVE CULTURE/GRAM STAIN    Collection Time: 03/27/21  4:29 PM    Specimen: Bronchoalveolar Lavage; Other   Culture Result Status    BRONCHOALVEOLAR LAVAGE (BAL) QUANTITATIVE CULTURE No Growth 3 Days Final    GRAM STAIN 4+ Many PMNs Final    GRAM STAIN No Organisms Seen Final   FUNGUS CULTURE    Collection Time: 03/27/21  4:29 PM    Specimen: Bronchoalveolar Lavage; Other   Culture Result Status    FUNGAL CULTURE No Growth 3 Days Preliminary   AFB CULTURE WITH STAIN    Collection Time: 03/27/21  4:29 PM    Specimen: Bronchoalveolar Lavage; Other   Culture Result Status    AFB SMEAR Auramine concentrated smear:  Negative Preliminary   TISSUE CULTURE (AEROBIC CULT & GRAM STAIN)    Collection Time: 03/27/21  4:33 PM    Specimen: Tissue   Culture Result Status    TISSUE CULTURE No Growth 4 Days Preliminary    GRAM STAIN 3+ Several PMNs Preliminary    GRAM STAIN No Organisms Seen Preliminary   FUNGUS CULTURE    Collection Time: 03/27/21  4:33 PM    Specimen: Tissue; Other   Culture Result Status    FUNGAL CULTURE No Growth 3 Days Preliminary   AFB CULTURE WITH STAIN    Collection Time: 03/27/21  4:33 PM    Specimen: Tissue; Other   Culture Result Status    AFB SMEAR Auramine concentrated smear:  Negative Preliminary   TISSUE CULTURE (AEROBIC CULT & GRAM STAIN)    Collection Time: 03/27/21  4:36 PM    Specimen: Tissue   Culture Result Status    TISSUE CULTURE No Growth 4 Days Preliminary    GRAM STAIN 1+ Rare PMNs Preliminary    GRAM STAIN No Organisms Seen Preliminary   FUNGUS CULTURE    Collection Time:  03/27/21  4:36 PM    Specimen: Tissue; Other   Culture Result Status    FUNGAL CULTURE No Growth 3 Days Preliminary   AFB CULTURE WITH STAIN    Collection Time: 03/27/21  4:36 PM    Specimen: Tissue; Other   Culture Result Status    AFB SMEAR Auramine concentrated smear:  Negative Preliminary   CRYPTOCOCCUS ANTIGEN SCREEN W/ TITER REFLEX, SERUM    Collection Time: 03/30/21  8:51 PM    Specimen: Serum; Blood   Culture Result Status    CRYPTOCOCCUS ANTIGEN SCREEN, SERUM Negative Final    Narrative    A negative antigen result does not exclude cryptococcal infection.  Cryptococcal antigen testing requires accompanying fungus culture for this reason.    This test should not be used to screen asymptomatic patients with low pre-test probability.  A positive result, particularly if low-titer, in this population may be falsely  positive.    Performed using the FDA-cleared immunochromatographic CrAg LFA assay (IMMY).       Richard Encounter on 03/22/21 (from the past 24 hour(s))   CRYPTOCOCCUS ANTIGEN SCREEN W/ TITER REFLEX, SERUM    Collection Time: 03/30/21  8:51 PM    Specimen: Serum; Blood   Culture Result Status    CRYPTOCOCCUS ANTIGEN SCREEN, SERUM Negative Final    Narrative    A negative antigen result does not exclude cryptococcal infection.  Cryptococcal antigen testing requires accompanying fungus culture for this reason.    This test should not be used to screen asymptomatic patients with low pre-test probability.  A positive result, particularly if low-titer, in this population may be falsely positive.    Performed using the FDA-cleared immunochromatographic CrAg LFA assay (IMMY).       Imaging Studies:            Assessment:  KROSS SWALLOWS is a 61 y.o., White male with history of who chronic back pain, GERD, and tobacco abuse who was transferred from [place] on 03/192023 for acute hypoxic respiratory failure. The patient has had ongoing symptoms of cough (with hemoptysis), worsening shortness of breath,  and night sweats for about 4 months. He completed two courses of oral antibiotics with temporary improvement in his symptoms. CT shows regions of masslike consolidation in the right middle and lower lobes and regions of focal internal air suggesting underlying necrosis or developing abscess. He is now s/p EBUS on 03/27/2021 where gross purulence was noted. Pathology is pending but BAL cultures are no growth to date which is not all that surprising given that he was on antibiotics for several days before the procedure was done. At this point differential includes bacterial, MRSA swab was negative so less likely MRSA. Patient did have some improvement on oral antibiotics and course may not have been adequate. Fungal is also a possibility with the more indolent course, including Aspergillus which commonly causes hemoptysis. TB less likely but given hemoptysis and night sweats, still a consideration though less likely with negative AFB smears. Additional workup has been sent including urine histo and blasto, Quantiferon, MTB PCR on BAL. HIV screening was negative as well as serum crypto antigen.       Recommendations:  -continue Zosyn 4.5g q8h  -[facility], may be able to transition to p.o. on discharge    Hepatitis C  -patient following at [facility] and planning to start [place] in the near future    Please continue to follow CBC with differential, BUN, creatinine, hepatic function panel, and CRP while the patient remains on antimicrobial therapy.    We will continue following at this time.  Please call or page the Infectious Diseases Service with any questions regarding this patient.    Georgian Co, DO PGY-4  Infectious Disease  Pager [phone]    I saw and examined the patient. I reviewed the fellow's note. I agree with the findings and plan of care as documented in the fellow's note. Any exceptions/additions are edited/noted.    On the day of the encounter, a total of  15 minutes was spent on this patient encounter including review of historical information, examination, documentation and post-visit activities. The time documented excludes procedural time.    Ortencia Kick, MD  Assistant Professor of Medicine  Infectious Diseases, Transplant

## 2021-04-01 ENCOUNTER — Encounter (HOSPITAL_COMMUNITY): Payer: Self-pay | Admitting: Internal Medicine

## 2021-04-01 DIAGNOSIS — G8929 Other chronic pain: Secondary | ICD-10-CM

## 2021-04-01 DIAGNOSIS — M4800 Spinal stenosis, site unspecified: Secondary | ICD-10-CM

## 2021-04-01 DIAGNOSIS — R918 Other nonspecific abnormal finding of lung field: Secondary | ICD-10-CM

## 2021-04-01 DIAGNOSIS — J984 Other disorders of lung: Secondary | ICD-10-CM

## 2021-04-01 DIAGNOSIS — J449 Chronic obstructive pulmonary disease, unspecified: Secondary | ICD-10-CM

## 2021-04-01 DIAGNOSIS — F17200 Nicotine dependence, unspecified, uncomplicated: Secondary | ICD-10-CM

## 2021-04-01 LAB — SURGICAL PATHOLOGY SPECIMEN

## 2021-04-01 LAB — ASPERGILLUS AG, BAL
ASPERGILLUS AG, EIA, BAL: NOT DETECTED
INDEX VALUE: 0.3 (ref <0.50)

## 2021-04-01 LAB — BASIC METABOLIC PANEL
ANION GAP: 8 mmol/L (ref 4–13)
BUN/CREA RATIO: 18 (ref 6–22)
BUN: 11 mg/dL (ref 8–25)
CALCIUM: 8.9 mg/dL (ref 8.8–10.2)
CHLORIDE: 104 mmol/L (ref 96–111)
CO2 TOTAL: 25 mmol/L (ref 23–31)
CREATININE: 0.6 mg/dL — ABNORMAL LOW (ref 0.75–1.35)
ESTIMATED GFR: >90 mL/min/BSA (ref >=60)
GLUCOSE: 75 mg/dL (ref 65–125)
POTASSIUM: 3.6 mmol/L (ref 3.5–5.1)
SODIUM: 137 mmol/L (ref 136–145)

## 2021-04-01 LAB — CBC
HCT: 30.9 % — ABNORMAL LOW (ref 38.9–52.0)
HGB: 9.8 g/dL — ABNORMAL LOW (ref 13.4–17.5)
MCH: 26.2 pg (ref 26.0–32.0)
MCHC: 31.7 g/dL (ref 31.0–35.5)
MCV: 82.6 fL (ref 78.0–100.0)
MPV: 9.3 fL (ref 8.7–12.5)
PLATELETS: 434 10*3/uL — ABNORMAL HIGH (ref 150–400)
RBC: 3.74 10*6/uL — ABNORMAL LOW (ref 4.50–6.10)
RDW-CV: 16.5 % — ABNORMAL HIGH (ref 11.5–15.5)
WBC: 9.5 10*3/uL (ref 3.7–11.0)

## 2021-04-01 LAB — MAGNESIUM: MAGNESIUM: 1.8 mg/dL (ref 1.8–2.6)

## 2021-04-01 LAB — TISSUE CULTURE (AEROBIC CULT & GRAM STAIN): GRAM STAIN: NONE SEEN

## 2021-04-01 LAB — CYTOPATHOLOGY, FINE NEEDLE ASPIRATE

## 2021-04-01 MED ORDER — ARFORMOTEROL 15 MCG/2 ML SOLUTION FOR NEBULIZATION
INHALATION_SOLUTION | RESPIRATORY_TRACT | Status: AC
Start: 2021-04-01 — End: 2021-04-01
  Filled 2021-04-01: qty 2

## 2021-04-01 MED ORDER — LIDOCAINE (PF) 10 MG/ML (1 %) INJECTION SOLUTION
0.5000 mL | Freq: Once | INTRAMUSCULAR | Status: AC
Start: 2021-04-01 — End: 2021-04-01
  Administered 2021-04-01: 0.5 mL via INTRADERMAL

## 2021-04-01 MED ORDER — SODIUM CHLORIDE 0.9 % (FLUSH) INJECTION SYRINGE
20.0000 mL | INJECTION | INTRAMUSCULAR | Status: DC | PRN
Start: 2021-04-01 — End: 2021-04-02

## 2021-04-01 MED ORDER — SODIUM CHLORIDE 0.9 % (FLUSH) INJECTION SYRINGE
10.0000 mL | INJECTION | Freq: Three times a day (TID) | INTRAMUSCULAR | Status: DC
Start: 2021-04-01 — End: 2021-04-02
  Administered 2021-04-01 – 2021-04-02 (×3): 0 mL

## 2021-04-01 NOTE — Discharge Instructions (Addendum)
PICC DISCHARGE INSTRUCTIONS      PICC Information:  4 FR Power PICC, Bard SOLO  with 1 lumen(s).  Placed in Right, Brachial Vein with tip location of SVC.   Total Catheter length is 44cm.  External Catheter length is 1cm.  Placed on 04/01/2021  Placed by Wilnette KalesJosh Coker BSN, RN, VA-BC   [facility] V[facility] Access Team:   Office Phone with Voicemail [phone] ext: [phone]  Pager [phone], pager [phone]     Flush Instruction:  use only a 10ML syringe; flush with a push; pause motion and 10ML saline daily    Blood Sampling Instructions  Flush 10ml saline, waste 2-5 ml, obtain specimen, flush 20ml saline and For multi-lumen line, draw specimen from distal port after stopping infusion for 2 minutes    Dressing Care Instructions:  Stat Lock securement device should be changed weekly with dressing change and as needed.  Change PICC cap/valve weekly and as needed, Use sterile technique, cover PICC and the device securement with transparent dressing at least weekly and as needed.   Peripherally Inserted Central Catheter (PICC)   Home Guide     A peripherally inserted central catheter (PICC) is a long, thin, flexible tube that is inserted into a vein in the upper arm. It is a form of intravenous (IV) access. It is considered to be a "central" line because the tip of the PICC ends in a large vein in your chest. This large vein is called the superior vena cava (SVC). The PICC tip ends in the SVC because there is a lot of blood flow in the SVC. This allows medicines and IV fluids to be quickly distributed throughout the body. The PICC is inserted using a sterile technique by a specially trained nurse or physician. After the PICC is inserted, a chest X-ray is done or a vascular positioning system is used to be sure it is in the correct place.   A PICC may be placed for different reasons, such as:   To give medicines and liquid nutrition that can only be given through a central line. Examples are:   Certain antibiotic treatments.    Chemotherapy.   Total parenteral nutrition (TPN).   To take frequent blood samples.   To give IV fluids and blood products.   If there is difficulty placing a peripheral intravenous (PIV) catheter.  If taken care of properly, a PICC can remain in place for several months. A PICC can also allow patients to go home early. Medicine and PICC care can be managed at home by a family member or home healthcare team.   RISKS AND COMPLICATIONS   Possible problems with a PICC can occasionally occur. This may include:   A clot (thrombus) forming in or at the tip of the PICC. This can cause the PICC to become clogged. A "clot-busting" medicine called tissue plasminogen activator (tPA) can be inserted into the PICC to help break up the clot.   Inflammation of the vein (phlebitis) in which the PICC is placed. Signs of inflammation may include redness, pain at the insertion site, red streaks, or being able to feel a "cord" in the vein where the PICC is located.   Infection in the PICC or at the insertion site. Signs of infection may include fever, chills, redness, swelling, or pus drainage from the PICC insertion site.   PICC movement (malposition). The PICC tip may migrate from its original position due to excessive physical activity, forceful coughing, sneezing, or vomiting.   A  break or cut in the PICC. It is important to not use scissors near the PICC.   Nerve or tendon irritation or injury during PICC insertion.  HOME CARE INSTRUCTIONS   Activity   You may bend your arm and move it freely. If your PICC is near or at the bend of your elbow, avoid activity with repeated motion at the elbow.   Avoid lifting heavy objects as instructed by your caregiver.   Avoid using a crutch with the arm on the same side as your PICC. You may need to use a walker.  PICC Dressing   Keep your PICC bandage (dressing) clean and dry to prevent infection.   Ask your caregiver when you may shower. Ask your caregiver to teach you how to wrap the PICC  when you do take a shower.   Do not bathe, swim, or use hot tubs when you have a PICC.   Change the PICC dressing as instructed by your caregiver.   Change your PICC dressing if it becomes loose or wet.  General PICC Care   Check the PICC insertion site daily for leakage, redness, swelling, or pain.   Flush the PICC as directed by your caregiver. Let your caregiver know right away if the PICC is difficult to flush or does not flush. Do not use force to flush the PICC.   Do not use a syringe that is less than 10 mLs to flush the PICC.   Never pull or tug on the PICC.   Avoid blood pressure checks on the arm with the PICC.   Keep your PICC identification card with you at all times.   Do not take the PICC out yourself. Only a trained clinical professional should remove the PICC.  SEEK IMMEDIATE MEDICAL CARE IF:   Your PICC is accidently pulled all the way out. If this happens, cover the insertion site with a bandage or gauze dressing. Do not throw the PICC away. Your caregiver will need to inspect it.   Your PICC was tugged or pulled and has partially come out. Do not push the PICC back in.   There is any type of drainage, redness, or swelling where the PICC enters the skin.   You cannot flush the PICC, it is difficult to flush, or the PICC leaks around the insertion site when it is flushed.   You hear a "flushing" sound when the PICC is flushed.   You have pain, discomfort, or numbness in your arm, shoulder, or jaw on the same side as the PICC .   You feel your heart "racing" or skipping beats.   You notice a hole or tear in the PICC.   You develop chills or a fever.  MAKE SURE YOU:   Understand these instructions.   Will watch your condition.   Will get help right away if you are not doing well or get worse.    This information is not intended to replace advice given to you by your health care provider. Make sure you discuss any questions you have with your health care provider.      If there are any other questions  you have, or if a medical problem should develop, please call [phone] ([phone]). Our transitions nurses are available to assist you Monday thru Friday from 8 am-4:30 pm. These nurses can help you with questions regarding your discharge instructions. If this is after 4:30 pm, a weekend, or holiday please call and ask to speak to the  doctor on call for the Hospitalist team.  In case of an emergency call 911.

## 2021-04-01 NOTE — Progress Notes (Signed)
[facility]  Medicine Progress Note  Full Code    Alexander Richard  Date of service: 04/01/2021    Subjective:   Patient states he feels much improved since last week when we lost had seen each other. He denies dyspnea including with increased ambulation in interim though does remain requiring 2L O2 via NC. He notes minimal cough and R. Flank pain which had been previously present.    Vital Signs:  Temp (24hrs) Max:37 C (68.0 F)      Systolic (88PJS), RPR:945 , Min:114 , OPF:292     Diastolic (44QKM), MNO:17, Min:74, Max:89    Temp  Avg: 36.8 C (98.3 F)  Min: 36.6 C (97.9 F)  Max: 37 C (98.6 F)  MAP (Non-Invasive)  Avg: 96.3 mmHG  Min: 87 mmHG  Max: 111 mmHG  Pulse  Avg: 82  Min: 72  Max: 97  Resp  Avg: 16  Min: 16  Max: 16  SpO2  Avg: 94.4 %  Min: 90 %  Max: 98 %       I/O:  I/O last 24 hours:    Intake/Output Summary (Last 24 hours) at 04/01/2021 0742  Last data filed at 04/01/2021 0658  Gross per 24 hour   Intake 597.5 ml   Output 850 ml   Net -252.5 ml     I/O current shift:  No intake/output data recorded.    acetaminophen (TYLENOL) tablet, 650 mg, Oral, Q4H PRN  albuterol (PROVENTIL) 2.56m/ 0.5 mL nebulizer solution, 2.5 mg, Nebulization, Q4H PRN  arformoterol (BROVANA) 15 mcg/2 mL nebulizer solution, 15 mcg, Nebulization, 2x/day  D5W 250 mL flush bag, , Intravenous, Q15 Min PRN  D5W 250 mL flush bag, , Intravenous, Q15 Min PRN  DULoxetine (CYMBALTA) delayed release capsule, 60 mg, Oral, Daily  enoxaparin PF (LOVENOX) 40 mg/0.4 mL SubQ injection, 40 mg, Subcutaneous, Daily  gabapentin (NEURONTIN) capsule, 600 mg, Oral, 3x/day  ketorolac (TORADOL) 15 mg/mL injection, 15 mg, Intravenous, Q6H PRN  lidocaine-menthol (LIDOPATCH) 3.6%-1.25% patch, 1 Patch, Transdermal, Daily  loperamide (IMODIUM) capsule, 2 mg, Oral, Q4H PRN  methocarbamol (ROBAXIN) tablet, 750 mg, Oral, 4x/day  morphine 2 mg/mL injection, 2 mg, Intravenous, Q4H PRN  nicotine (NICODERM CQ) transdermal patch (mg/24 hr), 21 mg,  Transdermal, Daily  NS 250 mL flush bag, , Intravenous, Q15 Min PRN  NS 250 mL flush bag, , Intravenous, Q15 Min PRN  NS flush syringe, 2-6 mL, Intracatheter, Q8HRS  NS flush syringe, 2-6 mL, Intracatheter, Q1 MIN PRN  NS flush syringe, 2-6 mL, Intracatheter, Q8HRS  NS flush syringe, 2-6 mL, Intracatheter, Q1 MIN PRN  ondansetron (ZOFRAN) 2 mg/mL injection, 4 mg, Intravenous, Q8H PRN  piperacillin-tazobactam (ZOSYN) 4.5 g in NS 100 mL IVPB, 4.5 g, Intravenous, Q8H  [Held by provider] sennosides-docusate sodium (SENOKOT-S) 8.6-50mg per tablet, 1 Tablet, Oral, 2x/day  tamsulosin (FLOMAX) capsule, 0.4 mg, Oral, Daily after Dinner  tiotropium bromide (SPIRIVA RESPIMAT) 2.5 mcg per inhalation oral inhaler - "Nursing to administer", 2 Puff, Inhalation, Daily  traMADol (ULTRAM) tablet, 50 mg, Oral, Q6H PRN        No Known Allergies    Physical Exam:  General: appears stated age, sitting up in bed, no distress  Eyes: conjunctiva clear, pupils equal and round  HENT: head atraumatic and normocephalic, on 2L O2 via NC  Lungs: clear to auscultation bilaterally, no crackles or wheezes  Cardiovascular: regular rate and rhythm, no murmurs, no rubs, no gallops  Abdomen: soft, non-tender, non-distended, bowel sounds normal  Extremities: no cyanosis, no LE edema  Skin: skin warm and dry, no rashes  Neurologic: AOx3, no gross deficits    Labs:  I have reviewed all lab results.  Lab Results Today:    Results for orders placed or performed during the hospital encounter of 03/22/21 (from the past 24 hour(s))   MYCOBACTERIUM TUBERCULOSIS COMPLEX PCR - SPUTUM & BAL     Specimen: Bronchoalveolar Lavage; Other   Result Value Ref Range    MYCOBACTERIUM TUBERCULOSIS COMPLEX Negative Negative    Rifampin resistance     CBC - AM ONCE   Result Value Ref Range    WBC 9.5 3.7 - 11.0 x10^3/uL    RBC 3.74 (L) 4.50 - 6.10 x10^6/uL    HGB 9.8 (L) 13.4 - 17.5 g/dL    HCT 30.9 (L) 38.9 - 52.0 %    MCV 82.6 78.0 - 100.0 fL    MCH 26.2 26.0 - 32.0 pg     MCHC 31.7 31.0 - 35.5 g/dL    RDW-CV 16.5 (H) 11.5 - 15.5 %    PLATELETS 434 (H) 150 - 400 x10^3/uL    MPV 9.3 8.7 - 12.5 fL   BASIC METABOLIC PANEL - AM ONCE   Result Value Ref Range    SODIUM 137 136 - 145 mmol/L    POTASSIUM 3.6 3.5 - 5.1 mmol/L    CHLORIDE 104 96 - 111 mmol/L    CO2 TOTAL 25 23 - 31 mmol/L    ANION GAP 8 4 - 13 mmol/L    CALCIUM 8.9 8.8 - 10.2 mg/dL    GLUCOSE 75 65 - 125 mg/dL    BUN 11 8 - 25 mg/dL    CREATININE 0.60 (L) 0.75 - 1.35 mg/dL    BUN/CREA RATIO 18 6 - 22    ESTIMATED GFR >90 >=60 mL/min/BSA   MAGNESIUM - AM ONCE   Result Value Ref Range    MAGNESIUM 1.8 1.8 - 2.6 mg/dL       Radiology:    I have reviewed imaging studies       PT/OT: Yes    Consults: pulmonary, ID    Assessment/ Plan:   Active Hospital Problems    Diagnosis   . Primary Problem: Pneumonia   . 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 Lung Mass vs Pneumonia (cannot rule-out gram negative) vs Abscess (probable)  Sepsis  COPD  -SIRS improved, WBC normalized in interval  - weaned to 2L O2 via NC (no previous baseline) wean further as tolerated  - will need oxygen challenge, patient is ambulatory at home and will require portable supplemental oxygen on discharge  - CT imaging with consolidation and cavitation at RML and towards lower lose concerning for necrotizing infection  - MRSA negative, sputum culture with oral flora, strep & legionella Ag negative, [employer] viral assay negative; PCT nondiagnostic  - blood cultures (3/20) remained without growth  - pulmonary consult completed bronchoscopy / EBUS on 3/24 with results showing acute on chronic inflammation without evident fungal or neoplastic features on obtained biopsy  - Duoneb QID and albuterol nebs PRN  - continue Brovana nebulizer BID and Spiriva daily (added this admission)  - infectious diseases consulted, appreciate  recs  - continue IV Zosyn for 3-4 week course with potential for PO de-escalation in 2  weeks, PICC line requested and OPAT consulted  - aggressive pulmonary toiletry, mobilization, P&V, incentive spirometry  - Tylenol, Tramadol, lidopatch, IV Toradol PRN for pain relief    Nicotine Dependence  - (3/20) counseled 7 minutes on smoking cessation  - continue NRT with 21 mg transdermal patch daily    Chronic Pain  Hx of Spinal Stenosis  - continue Neurontin 600 mg TID  - continue Robaxin 750 mg (increased to QID)  - continue Cymbalta 60 mg daily    Resolved  Hyponatremia    DVT/PE Prophylaxis: Enoxaparin  Disposition Planning: Home discharge      Orlene Erm, MD 12:08 04/01/2021  [place]    On 04/01/2021 I spent a total visit time of 36 minutes. Time included review of tests and ordering tests, obtaining/reviewing history, examining the patient, communicating with consultants, documenting clinical information and counseling the patient and/or family regarding the diagnosis and management plan and coordination of care involved services directly related to patient care.    FOLLOW UP NOTE LEVEL 2 (TOTAL TIME 35-50 MINUTES) [phone])

## 2021-04-01 NOTE — Nurses Notes (Signed)
Completed oxygen challenge, pt tolerated well. Pt said his son can come get him tomorrow, will communicate that with service.  Cherly Anderson, LPN  [id number], [phone]

## 2021-04-01 NOTE — Consults (Signed)
INFECTIOUS DISEASE FOLLOW UP CONSULTATION    Patient Name: Alexander Richard Number: [id number]  Date of Service: 04/01/2021  Date of Birth: [birth year]    Richard Day:  LOS: 10 days     Reason for Consultation: lung abscess     Subjective:  No overnight events noted. The patient continues to feel better. He is on 2L nasal cannula. He has been afebrile.     Objective:  Physical Exam:  Current Vitals: BP (!) 148/86 Comment: LPN notified  Pulse 84   Temp 36.6 C (97.9 F)   Resp 16   Ht 1.803 m (_0 )   Wt 98.8 kg (217 lb 13 oz)   SpO2 97%   BMI 30.38 kg/m       Vitals in last 24 hours: Temp  Avg: 36.8 C (98.2 F)  Min: 36.6 C (97.9 F)  Max: 37 C (98.6 F)  MAP (Non-Invasive)  Avg: 99.3 mmHG  Min: 87 mmHG  Max: 111 mmHG  Pulse  Avg: 83.3  Min: 72  Max: 97  Resp  Avg: 16  Min: 16  Max: 16  SpO2  Avg: 94.6 %  Min: 90 %  Max: 98 %     General: No acute distress.  Non-toxic appearing.  Eyes: No conjunctival injection or scleral icterus.  ENT: Face symmetrical.   Neck: Supple and symmetrical.    Lungs: Clear to auscultation bilaterally. Unlabored respirations. Diminished on the right.   Cardiovascular: Regular rate and rhythm.  Abdomen: Nondistended, soft, and nontender to palpation.    Extremities: No joint erythema or swelling. No cyanosis or edema.  Skin: Warm and dry. No rash or lesions.  Neurologic: Alert and oriented X 3. Moves all extremities without focal deficit.  Psychiatric: Appropriate affect and behavior. Intact memory. Fluent speech.     Inpatient Medications:  acetaminophen (TYLENOL) tablet, 650 mg, Oral, Q4H PRN  albuterol (PROVENTIL) 2.45m/ 0.5 mL nebulizer solution, 2.5 mg, Nebulization, Q4H PRN  arformoterol (BROVANA) 15 mcg/2 mL nebulizer solution, 15 mcg, Nebulization, 2x/day  D5W 250 mL flush bag, , Intravenous, Q15 Min PRN  D5W 250 mL flush bag, , Intravenous, Q15 Min PRN  DULoxetine (CYMBALTA) delayed release capsule, 60 mg, Oral, Daily  enoxaparin PF (LOVENOX) 40 mg/0.4 mL SubQ  injection, 40 mg, Subcutaneous, Daily  gabapentin (NEURONTIN) capsule, 600 mg, Oral, 3x/day  ketorolac (TORADOL) 15 mg/mL injection, 15 mg, Intravenous, Q6H PRN  lidocaine-menthol (LIDOPATCH) 3.6%-1.25% patch, 1 Patch, Transdermal, Daily  loperamide (IMODIUM) capsule, 2 mg, Oral, Q4H PRN  methocarbamol (ROBAXIN) tablet, 750 mg, Oral, 4x/day  morphine 2 mg/mL injection, 2 mg, Intravenous, Q4H PRN  nicotine (NICODERM CQ) transdermal patch (mg/24 hr), 21 mg, Transdermal, Daily  NS 250 mL flush bag, , Intravenous, Q15 Min PRN  NS 250 mL flush bag, , Intravenous, Q15 Min PRN  NS flush syringe, 2-6 mL, Intracatheter, Q8HRS  NS flush syringe, 2-6 mL, Intracatheter, Q1 MIN PRN  NS flush syringe, 2-6 mL, Intracatheter, Q8HRS  NS flush syringe, 2-6 mL, Intracatheter, Q1 MIN PRN  ondansetron (ZOFRAN) 2 mg/mL injection, 4 mg, Intravenous, Q8H PRN  piperacillin-tazobactam (ZOSYN) 4.5 g in NS 100 mL IVPB, 4.5 g, Intravenous, Q8H  [Held by provider] sennosides-docusate sodium (SENOKOT-S) 8.6-50mg per tablet, 1 Tablet, Oral, 2x/day  tamsulosin (FLOMAX) capsule, 0.4 mg, Oral, Daily after Dinner  tiotropium bromide (SPIRIVA RESPIMAT) 2.5 mcg per inhalation oral inhaler - "Nursing to administer", 2 Puff, Inhalation, Daily  traMADol (ULTRAM) tablet, 50 mg, Oral, Q6H PRN  Current Antimicrobials:  Antibiotics (From admission, onward)      Start     Stop Route Frequency    03/23/21 0000  piperacillin-tazobactam (ZOSYN) 4.5 g in NS 100 mL IVPB  (piperacillin-tazobactam (ZOSYN) IVPB load & maintenance dose)         -- IV EVERY 8 HOURS    03/22/21 2000  vancomycin (VANCOCIN) 1,250 mg in NS 250 mL IVPB  (vancomycin IVPB - load & maintenance dose)  Status:  Discontinued         03/20 1021 IV EVERY 12 HOURS    03/22/21 1800  piperacillin-tazobactam (ZOSYN) 4.5 g in NS 100 mL IVPB  (piperacillin-tazobactam (ZOSYN) IVPB load & maintenance dose)         03/19 1820 IV NOW             Lines:  Patient Lines/Drains/Airways Status       Active  Line / Dialysis Catheter / Dialysis Graft / Drain / Airway / Wound       Name Placement date Placement time Site Days    Midline Single Lumen Left 03/22/21  1639  --  9                     Laboratory Studies:  CBC Differential   Recent Labs     03/30/21  0430 03/31/21  0433 04/01/21  0607   WBC 13.5* 13.8* 9.5   HGB 11.0* 10.2* 9.8*   HCT 34.2* 31.2* 30.9*   PLTCNT 451* 421* 434*    Recent Labs     03/31/21  0433   PMNS 77   MONOCYTES 9   BASOPHILS 1  <0.10   PMNABS 10.70*   LYMPHSABS 1.38   MONOSABS 1.18*   EOSABS 0.19      BMP LFTs   Recent Labs     04/01/21  0607   SODIUM 137   POTASSIUM 3.6   CHLORIDE 104   CO2 25   BUN 11   CREATININE 0.60*   GLUCOSENF 75   ANIONGAP 8   BUNCRRATIO 18   GFR >90   CALCIUM 8.9   MAGNESIUM 1.8    No results found for this encounter   CoAgs Blood Gas:   No results found for this encounter No results found for this encounter    Cardiac Markers Lipid Panel   No results for input(s): TROPONINI, CKMB, MBINDEX, BNP in the last 72 hours. No results found for this encounter   Urine Analysis Other Labs   No results found for this encounter No results found for this encounter    Invalid input(s): [place]     Microbiology:  Richard Encounter on 03/22/21 (from the past 64 hour(s))   CRYPTOCOCCUS ANTIGEN SCREEN W/ TITER REFLEX, SERUM    Collection Time: 03/30/21  8:51 PM    Specimen: Serum; Blood   Culture Result Status    CRYPTOCOCCUS ANTIGEN SCREEN, SERUM Negative Final    Narrative    A negative antigen result does not exclude cryptococcal infection.  Cryptococcal antigen testing requires accompanying fungus culture for this reason.    This test should not be used to screen asymptomatic patients with low pre-test probability.  A positive result, particularly if low-titer, in this population may be falsely positive.    Performed using the FDA-cleared immunochromatographic CrAg LFA assay (IMMY).   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 This assay does not distinguish between MTB complex members (eg, M. tuberculosis, M. bovis, BCG, M. africanum, etc). Rifampin resistance prediction, based on detecting mutations in the M. tuberculosis rpoB gene, will not always correlate with phenotypic susceptibility results.     A negative result does not exclude infection by MTB. AFB culture remains the gold standard test and should be performed in parallel with this test.    Performed by PCR using the Xpert MTB-RIF assay and the Cepheid GeneXpert system. The test has been approved by the U.S. Food and Drug Administration (FDA) although acceptability of BAL fluid represents a laboratory modification. The test modification was developed for clinical use and its performance characteristics determined by [facility]. [facility] laboratory is regulated under CLIA as qualified to perform high-complexity testing.        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 This assay does not distinguish between MTB complex members (eg, M. tuberculosis, M. bovis, BCG, M. africanum, etc). Rifampin resistance prediction, based on detecting mutations in the M. tuberculosis rpoB gene, will not always correlate with phenotypic susceptibility results.     A negative result does not exclude infection by MTB. AFB culture remains the gold standard test and  should be performed in parallel with this test.    Performed by PCR using the Xpert MTB-RIF assay and the Cepheid GeneXpert system. The test has been approved by the U.S. Food and Drug Administration (FDA) although acceptability of BAL fluid represents a laboratory modification. The test modification was developed for clinical use and its performance characteristics determined by [facility]. [facility] laboratory is regulated under CLIA as qualified to perform high-complexity testing.        Imaging Studies:            Assessment:  KADYN GUILD is a 61 y.o., White male with history of who chronic back pain, GERD, and tobacco abuse who was transferred from [place] on 03/192023 for acute hypoxic respiratory failure. The patient has had ongoing symptoms of cough (with hemoptysis), worsening shortness of breath, and night sweats for about 4 months. He completed two courses of oral antibiotics with temporary improvement in his symptoms (Nov 25, 7 day course of amoxicillin + Z pack and Jan 26, 7 day course Levaquin 750). CT shows regions of masslike consolidation in the right middle and lower lobes and regions of focal internal air suggesting underlying necrosis or developing abscess. He is now s/p EBUS on 03/27/2021 where gross purulence was noted. Pathology is pending but BAL cultures are no growth to date which is not all that surprising given that he was on antibiotics for several days before the procedure was done. At this point differential includes bacterial, MRSA swab was negative so less likely MRSA. Patient did have some improvement on oral antibiotics and course may not have  been adequate. Fungal is also a possibility with the more indolent course, including Aspergillus which commonly causes hemoptysis. TB was considered but AFB smears and MTB PCR negatve. Additional workup has been sent including urine histo and blasto, Quantiferon. HIV screening was negative as well as  serum crypto antigen.     Given that he received Levaquin 750 and symptoms persisted, would continue with Zosyn for now, though it is possible he just needed a longer treatment course of the Levaquin. Will follow up in clinic and consider transitioning to p.o. at that time.     Recommendations:  -continue Zosyn 4.5g q8h  -recommend 3-4 weeks of Zosyn, though may be able to transition to p.o. after 2 weeks if patient is doing well  -please consult OPAT   -will need PICC line  -will follow up in clinic in 2 weeks     Hepatitis C  -patient following at [facility] and planning to start [place] in the near future    Please continue to follow CBC with differential, BUN, creatinine, hepatic function panel, and CRP while the patient remains on antimicrobial therapy.    We will sign off at this time.  Please call or page the Infectious Diseases Service with any questions regarding this patient.    Georgian Co, DO PGY-4  Infectious Disease  Pager [phone]    I saw and examined the patient. I reviewed the fellow's note. I agree with the findings and plan of care as documented in the fellow's note. Any exceptions/additions are edited/noted.    On the day of the encounter, a total of 18 minutes was spent on this patient encounter including review of historical information, examination, documentation and post-visit activities. The time documented excludes procedural time.    Ortencia Kick, MD  Assistant Professor of Medicine  Infectious Diseases, Transplant

## 2021-04-02 ENCOUNTER — Other Ambulatory Visit: Payer: Self-pay

## 2021-04-02 LAB — CBC WITH DIFF
BASOPHIL #: <0.1 10*3/uL (ref <=0.20)
BASOPHIL %: 1 %
EOSINOPHIL #: 0.24 10*3/uL (ref <=0.50)
EOSINOPHIL %: 3 %
HCT: 29.3 % — ABNORMAL LOW (ref 38.9–52.0)
HGB: 9.5 g/dL — ABNORMAL LOW (ref 13.4–17.5)
IMMATURE GRANULOCYTE #: 0.13 10*3/uL — ABNORMAL HIGH (ref <0.10)
IMMATURE GRANULOCYTE %: 2 % — ABNORMAL HIGH (ref 0–1)
LYMPHOCYTE #: 1.34 10*3/uL (ref 1.00–4.80)
LYMPHOCYTE %: 19 %
MCH: 26.5 pg (ref 26.0–32.0)
MCHC: 32.4 g/dL (ref 31.0–35.5)
MCV: 81.6 fL (ref 78.0–100.0)
MONOCYTE #: 0.69 10*3/uL (ref 0.20–1.10)
MONOCYTE %: 10 %
MPV: 9.2 fL (ref 8.7–12.5)
NEUTROPHIL #: 4.76 10*3/uL (ref 1.50–7.70)
NEUTROPHIL %: 65 %
PLATELETS: 454 10*3/uL — ABNORMAL HIGH (ref 150–400)
RBC: 3.59 10*6/uL — ABNORMAL LOW (ref 4.50–6.10)
RDW-CV: 16.6 % — ABNORMAL HIGH (ref 11.5–15.5)
WBC: 7.2 10*3/uL (ref 3.7–11.0)

## 2021-04-02 LAB — BASIC METABOLIC PANEL
ANION GAP: 6 mmol/L (ref 4–13)
BUN/CREA RATIO: 20 (ref 6–22)
BUN: 14 mg/dL (ref 8–25)
CALCIUM: 8.8 mg/dL (ref 8.8–10.2)
CHLORIDE: 104 mmol/L (ref 96–111)
CO2 TOTAL: 26 mmol/L (ref 23–31)
CREATININE: 0.7 mg/dL — ABNORMAL LOW (ref 0.75–1.35)
ESTIMATED GFR: >90 mL/min/BSA (ref >=60)
GLUCOSE: 86 mg/dL (ref 65–125)
POTASSIUM: 3.9 mmol/L (ref 3.5–5.1)
SODIUM: 136 mmol/L (ref 136–145)

## 2021-04-02 LAB — MAGNESIUM: MAGNESIUM: 1.7 mg/dL — ABNORMAL LOW (ref 1.8–2.6)

## 2021-04-02 MED ORDER — NICOTINE 7 MG/24 HR DAILY TRANSDERMAL PATCH
7.0000 mg | MEDICATED_PATCH | Freq: Every day | TRANSDERMAL | 0 refills | Status: AC
Start: 2021-04-02 — End: 2021-04-16

## 2021-04-02 MED ORDER — NITROGLYCERIN 50 MCG/ML IV DILUTION - FOR ANES
INJECTION | INTRAVENOUS | Status: AC
Start: 2021-04-02 — End: 2021-04-02
  Filled 2021-04-02: qty 20

## 2021-04-02 MED ORDER — SODIUM CHLORIDE 0.9 % INTRAVENOUS SOLUTION
INTRAVENOUS | 0 refills | Status: AC
Start: 2021-04-02 — End: ?

## 2021-04-02 MED ORDER — MAGNESIUM SULFATE 2 GRAM/50 ML (4 %) IN WATER INTRAVENOUS PIGGYBACK
2.0000 g | INJECTION | Freq: Once | INTRAVENOUS | Status: AC
Start: 2021-04-02 — End: 2021-04-02
  Administered 2021-04-02: 2 g via INTRAVENOUS
  Administered 2021-04-02: 0 g via INTRAVENOUS
  Filled 2021-04-02: qty 50

## 2021-04-02 MED ORDER — NICOTINE 14 MG/24 HR DAILY TRANSDERMAL PATCH
14.0000 mg | MEDICATED_PATCH | Freq: Every day | TRANSDERMAL | 0 refills | Status: AC
Start: 2021-04-02 — End: 2021-04-16

## 2021-04-02 MED ORDER — AMOXICILLIN 875 MG-POTASSIUM CLAVULANATE 125 MG TABLET
1.0000 | ORAL_TABLET | Freq: Two times a day (BID) | ORAL | 0 refills | Status: AC
Start: 2021-04-02 — End: 2021-04-16
  Filled 2021-04-02: qty 28, 14d supply, fill #0

## 2021-04-02 MED ORDER — NICOTINE 21 MG/24 HR DAILY TRANSDERMAL PATCH
21.0000 mg | MEDICATED_PATCH | Freq: Every day | TRANSDERMAL | 0 refills | Status: AC
Start: 2021-04-02 — End: 2021-04-30

## 2021-04-02 MED ORDER — BACITRACIN 500 UNIT/GRAM TOPICAL PACKET
1.0000 | PACK | Freq: Once | CUTANEOUS | Status: AC
Start: 2021-04-02 — End: 2021-04-02
  Administered 2021-04-02: 1 via TOPICAL
  Filled 2021-04-02: qty 1

## 2021-04-02 NOTE — Transitional Care (Addendum)
Hospital discharge follow up appointment is requested  for 1/- weeks with [facility].  Called and gave the information to scheduling who will give the patients information to the patients PCP team and the nurse from the aqua team will call back with an appointment date and time for his follow up. Information added to the AVS.  Milton Ferguson, LPN  6/00/4599, 12:59    [facility] with [facility] discharge follow up appointment date and time he is scheduled for 04/13/21 at 3:00pm. Date and time added to the AVS. Milton Ferguson, LPN  7/74/1423, 13:53

## 2021-04-02 NOTE — Consults (Signed)
Outpatient Parenteral Antimicrobial Therapy Program      Patient Name: Alexander Richard  MRN: [id number]  Patient Address: [address] [place] [address]  DOB: 1960/08/10  Date: 04/02/21    Outpatient Antimicrobial Treatment Plan     Diagnosis Group:  Diagnosis:   Microorganisms:   Chest infection Lung abscess   None isolated       Antibiotics  IV Antibiotics:  Dose  Frequency Anticipated Stop date:  Comments:   Piperacillin- Tazobactam 18 g Every 24 hours over continuous infusion 04/18/21 Reflects 4 weeks of therapy from 03/22/21 when piperacillin-tazobactam therapy started     Important drug interactions:      Allergies:   No Known Allergies         Labs monitoring plan/orders  Labs and Frequency Additional Comments   CBC/diff, BUN, Creatinine, Hepatic function panel and Inflammatory CRP Every Monday       OPAT Pharmacist:  Dr. Penny Pia  Phone Number: [phone] Ext: [phone]  Fax Labs To: [phone]     Physician Monitoring OPAT Course:   Dr. Diona Foley  Phone Number: [phone]  Fax Labs To: [phone]       ID follow-up:   2 weeks        Attending Physician     Dr. Johnsie Kindred       If there are any changes to antibiotic plan or new culture results after the OPAT note has been placed, please contact us so we can update the OPAT note.        Genevieve Norlander, PHARMD     I agree with the findings and plan of care as documented in the student/resident note.  Any exceptions/additions are edited/noted.     Penny Pia, PharmD, BCIDP  Infectious Diseases Clinical Pharmacist  [phone]

## 2021-04-02 NOTE — Discharge Summary (Signed)
[facility]  DISCHARGE SUMMARY    PATIENT NAME:  Alexander Richard, Alexander Richard  MRN:  [id number]  DOB:  June 29, 1960    ENCOUNTER DATE:  03/22/2021  INPATIENT ADMISSION DATE: 03/22/2021  DISCHARGE DATE:  04/02/2021    ATTENDING PHYSICIAN: Dione Booze*  SERVICE: HOSPITALIST 5  PRIMARY CARE PHYSICIAN: [facility]     No lay caregiver identified.      PRIMARY DISCHARGE DIAGNOSIS: Pneumonia  Active Hospital Problems    Diagnosis Date Noted   . Principal Problem: Pneumonia [J18.9] 03/22/2021   . Sepsis (CMS [place]) [A41.9] 03/22/2021      Resolved Hospital Problems   No resolved problems to display.     There are no active non-hospital problems to display for this patient.          Current Discharge Medication List      START taking these medications.      Details   amoxicillin-pot clavulanate 875-125 mg Tablet  Commonly known as: AUGMENTIN   1 Tablet, Oral, 2 TIMES DAILY  Qty: 28 Tablet  Refills: 0     * nicotine 21 mg/24 hr Patch 24 hr  Commonly known as: NICODERM CQ   21 mg, Transdermal, DAILY  Qty: 28 Patch  Refills: 0     * nicotine 14 mg/24 hr Patch 24 hr  Commonly known as: NICODERM CQ   14 mg, Transdermal, DAILY  Qty: 14 Patch  Refills: 0     * nicotine 7 mg/24 hr Patch 24 hr  Commonly known as: NICODERM CQ   7 mg, Transdermal, DAILY  Qty: 14 Patch  Refills: 0     NS Parenteral Solution 500 mL with piperacillin-tazobactam 225 mg/mL (of ZOSYN) Recon Soln   Mix and infuse per home health policy  Qty: 110 Each  Refills: 0         * This list has 3 medication(s) that are the same as other medications prescribed for you. Read the directions carefully, and ask your doctor or other care provider to review them with you.            CONTINUE these medications - NO CHANGES were made during your visit.      Details   albuterol sulfate 90 mcg/actuation oral inhaler  Commonly known as: PROVENTIL or VENTOLIN or PROAIR   2 Puffs, Inhalation, 4 TIMES DAILY PRN  Refills: 0     DULoxetine 60 mg Capsule, Delayed  Release(E.C.)  Commonly known as: CYMBALTA DR   60 mg, Oral, DAILY  Refills: 0     gabapentin 600 mg Tablet  Commonly known as: NEURONTIN   600 mg, Oral, 3 TIMES DAILY  Refills: 0     methocarbamoL 750 mg Tablet  Commonly known as: ROBAXIN   750 mg, Oral, 2 TIMES DAILY  Refills: 0     Stiolto Respimat 2.5-2.5 mcg/actuation Mist  Generic drug: tiotropium-olodateroL   2 INHALATION, Inhalation, DAILY  Refills: 0     tamsulosin 0.4 mg Capsule  Commonly known as: FLOMAX   0.4 mg, Oral, EVERY EVENING AFTER DINNER  Refills: 0          Discharge med list refreshed?  YES     No Known Allergies  HOSPITAL PROCEDURE(S):   Orders Placed This Encounter   Procedures   . BEDSIDE  BRONCHOSCOPY     Surgical/Procedural Cases on this Admission     Case IDs Date Procedure Surgeon Location Status    [id number] 03/27/21 ELECTRO MAGNETIC NAVIGATION BRONCHOSCOPYULTRASOUND  ENDOBRONCHIAL Suann Larry, MD [place] AND HOSPITAL COURSE   BRIEF HPI:    This is a 61 y.o., male admitted after after several days of hemoptysis, fever/chills at home following several recent hospitalizations.  After CT imaging showed worsening right middle lobe consolidation with cavitary portion patient was transferred to [facility].    BRIEF HOSPITAL NARRATIVE:     Broad-spectrum antibiotics were initiated with IV vancomycin and Zosyn.  He would not require intubation.  After transfer to step-down pulmonary consult completed bronchoscopy and EBUS on 03/27/20 which showed only acute on chronic inflammation and did not show evidence of fungal or neoplastic features.  Infectious Disease was also consulted patient was continued on treatment for pulmonary abscess with IV Zosyn. This had been planned for 4 week course with potential de-escalation at clinic follow-up at two weeks to oral antibiotic; however, patient would decide to leave AMA on 04/02/21 prior to confirmation of arrangements for IV antibiotics. At discharge  patient was provided with prescription for 2 weeks of Augmentin should he be unable to [facility] by [facility], but was also discharged with PICC line that had been placed for if he is able to complete planned arrangement for IV zosyn which was intended. Patient was afebrile, with improved inflammatory markers, and with stable oxygen saturation on RA (following oxygen challenge); however he did demonstrate understanding that we are unable to facilitate further IV antibiotic arrangement after discharge and that the oral alternative antibiotic being written for may be inadequate as treatment for his pulmonary abscess, and left against medical advice understanding these risks.    TRANSITION/POST DISCHARGE CARE/PENDING TESTS/REFERRALS:   - arrangement for IV antibiotic still pending at discharge AMA, he plans to try to arrange [facility] tomorrow    CONDITION ON DISCHARGE:  A. Ambulation: Full ambulation  B. Self-care Ability: Complete  C. Cognitive Status Alert and Oriented x 3  D. Code status at discharge:       LINES/DRAINS/WOUNDS AT DISCHARGE:   Patient Lines/Drains/Airways Status     Active Line / Dialysis Catheter / Dialysis Graft / Drain / Airway / Wound     Name Placement date Placement time Site Days    PICC Single Lumen Right;Brachial Vein Central 04/01/21  1530  4 FR  1    Midline Single Lumen Left 03/22/21  1639  --  10                DISCHARGE DISPOSITION:  Against medical advice  DISCHARGE INSTRUCTIONS:  Post-Discharge Follow Up Appointments     Monday Apr 13, 2021    Go to [place], [place]    Phone: [phone]    Where: [address] [place] [address]           Refer to [place]    Since you do not have verification of IV antibiotic arrangements with you prior to leaving the hospital, we are writing a prescription for Augmentin which is an oral antibiotic similar though not as complete coverage as IV Zosyn in case  you are unable to obtain latter. If you not able to make IV antibiotic arrangements, you will need to go to a healthcare provider who can remove PICC line as soon as possible.     DME - INFUSION AND LINE CARE ORDERS  Diagnosis Group:  Diagnosis:   Microorganisms:  Chest infection Lung abscess   None isolated      Antibiotics  IV Antibiotics:  Dose  Frequency Anticipated Stop date:  Comments:  Piperacillin- Tazobactam 18 g Every 24 hours over continuous infusion 04/18/21 Reflects 4 weeks of therapy from 03/22/21 when piperacillin-tazobactam therapy started    Important drug interactions:    Allergies:  No Known Allergies        Labs monitoring plan/orders  Labs and Frequency Additional Comments  CBC/diff, BUN, Creatinine, Hepatic function panel and Inflammatory CRP Every Monday      OPAT Pharmacist:  Dr. Tobie Poet  Phone Number: [phone] Ext: [phone]  Fax Labs To: [phone]    Physician Monitoring OPAT Course:   Dr. Matilde Sprang  Phone Number: [phone]  Fax Labs To: [phone]      ID follow-up:   2 weeks       Attending Physician    Dr. Leda Quail      If there are any changes to antibiotic plan or new culture results after the OPAT note has been placed, please contact us so we can update the OPAT note.        Dayle Points, PHARMD     Contact information for lab follow up ([place] Only) - Dr. Murvin Natal / Dr. Matilde Sprang  Fax [phone]    Freedom of Choice: I have informed patient of their freedom of choice with respect to DME providers    Type of Line [place] and Dressing Change Per [place] Protocol YES    Please draw the following labs All other antimicrobials- CBC/diff, BUN/Cr labs every Monday   Please draw the following labs Other- CRP (weekly)    Please draw the following labs Other -  AST, ALT, ALK PHOS, TOTAL BILI (weekly)           Laurier Nancy, MD    Copies sent to Care Team       Relationship Specialty Notifications Start End    [place],  [place] PCP - General EXTERNAL  04/02/21     Phone: [phone] Fax: [phone]         [place] [id number]          Referring providers can utilize [email or web address] to access their referred [place] patient's information.    Outpatient Antimicrobial Treatment Plan     Diagnosis Group:  Diagnosis:   Microorganisms:   Chest infection Lung abscess   None isolated       Antibiotics  IV Antibiotics:  Dose  Frequency Anticipated Stop date:  Comments:   Piperacillin- Tazobactam 18 g Every 24 hours over continuous infusion 04/18/21 Reflects 4 weeks of therapy from 03/22/21 when piperacillin-tazobactam therapy started     Important drug interactions:      Allergies:   No Known Allergies         Labs monitoring plan/orders  Labs and Frequency Additional Comments   CBC/diff, BUN, Creatinine, Hepatic function panel and Inflammatory CRP Every Monday       OPAT Pharmacist:  Dr. Tobie Poet  Phone Number: [phone] Ext: [phone]  Fax Labs To: [phone]     Physician Monitoring OPAT Course:   Dr. Matilde Sprang  Phone Number: [phone]  Fax Labs To: [phone]       ID follow-up:   2 weeks        Attending Physician     Dr. Leda Quail  If there are any changes to antibiotic plan or new culture results after the OPAT note has been placed, please contact us so we can update the OPAT note.        Dayle Points, PHARMD

## 2021-04-02 NOTE — Nurses Notes (Signed)
Patient discharged home with family.  AVS reviewed with patient/care giver.  A written copy of the AVS and discharge instructions was given to the patient/care giver.  Questions sufficiently answered as needed.  Patient/care giver encouraged to follow up with PCP as indicated.  In the event of an emergency, patient/care giver instructed to call 911 or go to the nearest emergency room.

## 2021-04-02 NOTE — Progress Notes (Signed)
[facility]  [facility] Note  Full Code    Alexander MemosMichael E Richard  Date of service: 04/02/2021    Subjective:   Patient continues to endorse feeling much improved and denies dyspnea even when ambulatory lately. He denies chest pain and subjective fever. He has been able to stay off supplemental O2 since passing oxygen challenge yesterday. Patient is eager to return home today, though we discussed safe discharge will be contingent on if IV antibiotic arrangements which may not happen within the day.    Vital Signs:  Temp (24hrs) Max:37 C (98.6 F)      Systolic (24hrs), Avg:132 , Min:111 , Max:148     Diastolic (24hrs), Avg:80, Min:67, Max:87    Temp  Avg: 36.8 C (98.2 F)  Min: 36.6 C (97.9 F)  Max: 37 C (98.6 F)  MAP (Non-Invasive)  Avg: 93 mmHG  Min: 77 mmHG  Max: 107 mmHG  Pulse  Avg: 86.3  Min: 78  Max: 97  Resp  Avg: 17.3  Min: 16  Max: 18  SpO2  Avg: 94.8 %  Min: 91 %  Max: 97 %       I/O:  I/O last 24 hours:      Intake/Output Summary (Last 24 hours) at 04/02/2021 0724  Last data filed at 04/01/2021 1147  Gross per 24 hour   Intake 500 ml   Output --   Net 500 ml     I/O current shift:  No intake/output data recorded.    acetaminophen (TYLENOL) tablet, 650 mg, Oral, Q4H PRN  albuterol (PROVENTIL) 2.5mg / 0.5 mL nebulizer solution, 2.5 mg, Nebulization, Q4H PRN  arformoterol (BROVANA) 15 mcg/2 mL nebulizer solution, 15 mcg, Nebulization, 2x/day  D5W 250 mL flush bag, , Intravenous, Q15 Min PRN  D5W 250 mL flush bag, , Intravenous, Q15 Min PRN  DULoxetine (CYMBALTA) delayed release capsule, 60 mg, Oral, Daily  enoxaparin PF (LOVENOX) 40 mg/0.4 mL SubQ injection, 40 mg, Subcutaneous, Daily  gabapentin (NEURONTIN) capsule, 600 mg, Oral, 3x/day  ketorolac (TORADOL) 15 mg/mL injection, 15 mg, Intravenous, Q6H PRN  lidocaine-menthol (LIDOPATCH) 3.6%-1.25% patch, 1 Patch, Transdermal, Daily  loperamide (IMODIUM) capsule, 2 mg, Oral, Q4H PRN  magnesium sulfate 2 G in SW 50 mL premix IVPB, 2 g,  Intravenous, Once  methocarbamol (ROBAXIN) tablet, 750 mg, Oral, 4x/day  nicotine (NICODERM CQ) transdermal patch (mg/24 hr), 21 mg, Transdermal, Daily  NS 250 mL flush bag, , Intravenous, Q15 Min PRN  NS 250 mL flush bag, , Intravenous, Q15 Min PRN  NS flush syringe, 2-6 mL, Intracatheter, Q8HRS  NS flush syringe, 2-6 mL, Intracatheter, Q1 MIN PRN  NS flush syringe, 2-6 mL, Intracatheter, Q8HRS  NS flush syringe, 2-6 mL, Intracatheter, Q1 MIN PRN  NS flush syringe, 10-30 mL, Intracatheter, Q8HRS  NS flush syringe, 20-30 mL, Intracatheter, Q1 MIN PRN  ondansetron (ZOFRAN) 2 mg/mL injection, 4 mg, Intravenous, Q8H PRN  piperacillin-tazobactam (ZOSYN) 4.5 g in NS 100 mL IVPB, 4.5 g, Intravenous, Q8H  [Held by provider] sennosides-docusate sodium (SENOKOT-S) 8.6-50mg  per tablet, 1 Tablet, Oral, 2x/day  tamsulosin (FLOMAX) capsule, 0.4 mg, Oral, Daily after Dinner  tiotropium bromide (SPIRIVA RESPIMAT) 2.5 mcg per inhalation oral inhaler - "Nursing to administer", 2 Puff, Inhalation, Daily  traMADol (ULTRAM) tablet, 50 mg, Oral, Q6H PRN        No Known Allergies    Physical Exam:  General: appears stated age, sitting up in chair, no distress  Eyes: conjunctiva clear, pupils equal and round  HENT: head atraumatic and normocephalic  Lungs: remains clear to auscultation bilaterally, no crackles or wheezes  Cardiovascular: regular rate and rhythm, no murmurs, no rubs, no gallops  Abdomen: soft, non-tender, non-distended, bowel sounds normal  Extremities: no cyanosis, no LE edema  Skin: skin warm and dry, no rashes  Neurologic: alert, responding appropriately, no gross deficits    Labs:  I have reviewed all lab results.  Lab Results Today:    Results for orders placed or performed during the hospital encounter of 03/22/21 (from the past 24 hour(s))   MAGNESIUM   Result Value Ref Range    MAGNESIUM 1.7 (L) 1.8 - 2.6 mg/dL   BASIC METABOLIC PANEL   Result Value Ref Range    SODIUM 136 136 - 145 mmol/L    POTASSIUM 3.9 3.5 -  5.1 mmol/L    CHLORIDE 104 96 - 111 mmol/L    CO2 TOTAL 26 23 - 31 mmol/L    ANION GAP 6 4 - 13 mmol/L    CALCIUM 8.8 8.8 - 10.2 mg/dL    GLUCOSE 86 65 - 144 mg/dL    BUN 14 8 - 25 mg/dL    CREATININE 3.15 (L) 0.75 - 1.35 mg/dL    BUN/CREA RATIO 20 6 - 22    ESTIMATED GFR >90 >=60 mL/min/BSA   CBC WITH DIFF   Result Value Ref Range    WBC 7.2 3.7 - 11.0 x10^3/uL    RBC 3.59 (L) 4.50 - 6.10 x10^6/uL    HGB 9.5 (L) 13.4 - 17.5 g/dL    HCT 40.0 (L) 86.7 - 52.0 %    MCV 81.6 78.0 - 100.0 fL    MCH 26.5 26.0 - 32.0 pg    MCHC 32.4 31.0 - 35.5 g/dL    RDW-CV 61.9 (H) 50.9 - 15.5 %    PLATELETS 454 (H) 150 - 400 x10^3/uL    MPV 9.2 8.7 - 12.5 fL    NEUTROPHIL % 65 %    LYMPHOCYTE % 19 %    MONOCYTE % 10 %    EOSINOPHIL % 3 %    BASOPHIL % 1 %    NEUTROPHIL # 4.76 1.50 - 7.70 x10^3/uL    LYMPHOCYTE # 1.34 1.00 - 4.80 x10^3/uL    MONOCYTE # 0.69 0.20 - 1.10 x10^3/uL    EOSINOPHIL # 0.24 <=0.50 x10^3/uL    BASOPHIL # <0.10 <=0.20 x10^3/uL    IMMATURE GRANULOCYTE % 2 (H) 0 - 1 %    IMMATURE GRANULOCYTE # 0.13 (H) <0.10 x10^3/uL       Radiology:    I have reviewed imaging studies       PT/OT: Yes    Consults: pulmonary, ID    Assessment/ Plan:   Active Hospital Problems    Diagnosis   . Primary Problem: Pneumonia   . 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 Lung Mass vs Pneumonia (cannot rule-out gram negative) vs Abscess (probable)  Sepsis  COPD  -SIRS improved, WBC normalized in interval  - remains weaned to RA, passed ambulatory oxygen challenge  - CT imaging with consolidation and cavitation at RML and towards lower lose concerning for necrotizing infection  - MRSA negative, sputum culture with oral flora, strep & legionella Ag negative, Biofire viral assay negative; PCT nondiagnostic  - blood cultures (3/20)  remained without growth  - pulmonary consult completed bronchoscopy /  EBUS on 3/24 with results showing acute on chronic inflammation without evident fungal or neoplastic features on obtained biopsy  - Duoneb QID and albuterol nebs PRN  - continue Brovana nebulizer BID and Spiriva daily (added this admission)  - infectious diseases consulted, appreciate recs  - continue IV Zosyn for 4 week course (end: 4/15) with potential for PO de-escalation in 2 weeks at f/u prior to completion of course, PICC line placed, OPAT consulted  - aggressive pulmonary toiletry, mobilization, P&V, incentive spirometry  - Tylenol, Tramadol, lidopatch, IV Toradol PRN for pain relief    Nicotine Dependence  - (3/20) counseled 7 minutes on smoking cessation  - continue NRT with 21 mg transdermal patch daily    Chronic Pain  Hx of Spinal Stenosis  - continue Neurontin 600 mg TID  - continue Robaxin 750 mg (increased to QID)  - continue Cymbalta 60 mg daily    Resolved  Hyponatremia    DVT/PE Prophylaxis: Enoxaparin  Disposition Planning: Home discharge      Asencion Noble, MD 07:24 04/02/2021  Hospitalist  [facility]    On 04/02/2021 I spent a total visit time of 37 minutes. Time included review of tests and ordering tests, obtaining/reviewing history, examining the patient, communicating with consultants, documenting clinical information and counseling the patient and/or family regarding the diagnosis and management plan and coordination of care involved services directly related to patient care.    FOLLOW UP NOTE LEVEL 2 (TOTAL TIME 35-50 MINUTES) [phone])

## 2021-04-02 NOTE — Transitional Care (Signed)
[facility] Medicine  Transitional Care Coordination      Initial Assessment  Name: Alexander Richard  Address: [address]  [place] [address]  Date of Birth: 1960/06/15 61 y.o.  Date of Service: 04/02/2021  Lay Caregiver:  Wife / Britta Mccreedy      1.  Obtained  consent to contact and schedule a PCP follow up in 7-14 days per PCP request.   2.  PCP listed as Gwenith Daily, MD. This is not correct per patient he sees Dr. Randa Evens in the [place] clinic at the [facility] .   Last visit: 03/22/21  3.  Patient appointment preferences: prefers around noon    4.  Transportation to healthcare appointments: Drives himself to his appts  5.  Patient MyChart status: MyChart active  6. Preferred method of contact: call mobile number   7.  Confirmed patient contact information: Home number listed is invalid and needs removed   Home Phone [phone]   Work Phone [phone]   Mobile [phone]     8.  Obtained consent to speak with  lay caregiver: Rutherford Nail verbal  permission to speak to wife   9.  Confirmed patient address listed above:  Address is correct  10.  [facility]   Preferred Pharmacy     [facility] - [place], [place] - [address]    [address] [address]    Phone: [phone] Fax: [phone]    Hours: Not open 24 hours    [facility]    [address] [place] [address]    Phone: [phone] Fax: [phone]    Hours: 24/7            Spoke with patient to discuss provider of primary care services.  Discussed role of Transitional Care Coordination Team and informed of contact within 2 business days of discharge to assess follow-up plan.    [place], [place]  8/56/3149, 12:34

## 2021-04-02 NOTE — Progress Notes (Signed)
Interval Note:    Patient is adamant about returning home this evening with his family despite knowing that we do not have arrangements for IV antibiotics arranged. I discussed that he is at risk of having worsening of his lung infection with inadequate completion of recommended antibiotic course and he indicates that he understands these risks and still wants to leave against medical advice with his family this evening. AMA paperwork was completed at bedside. Patient is planning on going to [place] tomorrow morning to try to make his own arrangements so will keep his PICC line on discharge. He was instructed that if he is successful in arrangement for IV antibiotic that he should discard the oral antibiotic (Augmentin) that we are also prescribing, and that if he is unable to arrange for IV antibiotics by himself he will need to have a healthcare provider who can remove his PICC line do so as soon as possible.    Orlene Erm, MD 16:28 04/02/2021  [place]

## 2021-04-02 NOTE — Care Management Notes (Signed)
[facility]  Care Management Note    Patient Name: Alexander Richard  Date of Birth: 11-02-1960  Sex: male  Date/Time of Admission: 03/22/2021  4:29 PM  Room/Bed: 958/A  Payor: [place] / Plan: [place] [employer]/[employer] / Product Type: Managed Care /    LOS: 11 days   Primary Care Providers:  [place]    Admitting Diagnosis:  Sepsis (CMS [place]) [A41.9]    Assessment:      04/02/21 1506   Assessment Details   Assessment Type Continued Assessment   Date of Care Management Update 04/02/21   Date of Next DCP Update 04/03/21   Care Management Plan   Discharge Planning Status plan in progress   Projected Discharge Date 04/03/21   Discharge plan discussed with: Patient   CM will evaluate for rehabilitation potential yes   Patient choice offered to patient/family no   Discharge Needs Assessment   Discharge Facility/Level of Care Needs Home with Home Health and Home Infusion       Per service, pt needing IV abx and home health for d/c.  MSW met with pt, he confirmed that he is active with the [facility] for his primary care.  MSW called the [facility] and spoke to [place] in [facility] [phone]).  Lanny Hurst confirmed that the pt is active with them and said that orders for HH/HI would need to be faxed to them and confirmed by the pt's PCP with the [facility] (fax: [phone]) before the [facility] can start to arrange those services.  MSW faxed over orders.  Currently waiting on the [facility] to confirm [facility] and IV abx are setup.      Discharge Plan:  Home with Home Health and Home Infusion      The patient will continue to be evaluated for developing discharge needs.     Case Manager: Joella Prince, [place]  Phone: [phone]

## 2021-04-03 ENCOUNTER — Other Ambulatory Visit (HOSPITAL_COMMUNITY): Payer: Self-pay | Admitting: PULMONARY DISEASE

## 2021-04-03 ENCOUNTER — Encounter (INDEPENDENT_AMBULATORY_CARE_PROVIDER_SITE_OTHER): Payer: Self-pay | Admitting: Student in an Organized Health Care Education/Training Program

## 2021-04-03 DIAGNOSIS — J189 Pneumonia, unspecified organism: Secondary | ICD-10-CM

## 2021-04-03 LAB — QUANTIFERON TB2: QFT TB2: 0.67 IU/mL

## 2021-04-03 LAB — QUANTIFERON TB MITOGEN: QFT MITOGEN: >10 IU/mL

## 2021-04-03 LAB — QUANTIFERON: QFT QUALITATIVE: POSITIVE — AB

## 2021-04-03 LAB — QUANTIFERON TB NIL: QFT NIL: 0.146 IU/mL

## 2021-04-03 LAB — BLASTOMYCES ANTIGEN, QUANTITATIVE: Interpretation:: NEGATIVE

## 2021-04-03 LAB — QUANTIFERON TB1: QFT TB1: 0.58 IU/mL

## 2021-04-03 NOTE — Progress Notes (Signed)
Contacted Mellody Dance at [facility] to clarify process for patient to receive outpatient IV antibiotics. Per Mellody Dance, patient will need to provide paper order to [facility] or have order faxed to [phone] Attn: [facility]. Orders will then be sent to PCP for approval, and then order will be sent to pharmacy.     Attempted to contact Mr. Lenz to inquire whether he had paper orders or if he was planning on setting the process up. Unable to reach patient.       Genevieve Norlander, PHARMD    Penny Pia, PharmD, BCIDP  Infectious Diseases Clinical Pharmacist  [phone]

## 2021-04-03 NOTE — Progress Notes (Signed)
Follow up placed with Pulmon with same day CT Chest to follow up cavitary lesion. Discussed with Dr. Darcel Bayley.     Alcide Clever, MD  PGY5 PCCM Fellow  [facility]

## 2021-04-04 LAB — HISTOPLASMA GALACTOMANNAN ANTIGEN, URINE

## 2021-04-05 ENCOUNTER — Other Ambulatory Visit: Payer: Self-pay

## 2021-04-06 ENCOUNTER — Encounter (INDEPENDENT_AMBULATORY_CARE_PROVIDER_SITE_OTHER): Payer: Self-pay | Admitting: Student in an Organized Health Care Education/Training Program

## 2021-04-06 LAB — ASPERGILLUS PCR, BAL
A. FUMIGATUS: NOT DETECTED
A. TERREUS: NOT DETECTED
ASPERGILLUS SPP: NOT DETECTED

## 2021-04-06 NOTE — Progress Notes (Signed)
Spoke with Prudence Davidson, pharmacist with [facility]. Saralyn Pilar stated patient had successfully supplied orders for outpatient IV antibiotics. The orders wereverified by the [facility] pharmacy on the morning of 4/3, and they were subsequently sent to and off site compounding pharmacy. Saralyn Pilar stated patient would receive his first day of therapy today. Thus, patient will have gone 3 days without any antimicrobial therapy. Saralyn Pilar stated orders for labs were obtained. Additionally, labs should be faxed to [facility] OPAT team, but the [facility] will also obtain results of labs as well. OPAT pharmacy team will continue to follow as indicated.       Dayle Points, PHARMD

## 2021-04-13 ENCOUNTER — Other Ambulatory Visit: Payer: 59 | Attending: OBSTETRICS/GYNECOLOGY | Admitting: OBSTETRICS/GYNECOLOGY

## 2021-04-13 DIAGNOSIS — J189 Pneumonia, unspecified organism: Secondary | ICD-10-CM | POA: Insufficient documentation

## 2021-04-13 LAB — CREATININE WITH ESTIMATED GFR
CREATININE: 0.72 mg/dL (ref 0.60–1.30)
ESTIMATED GFR: 105 mL/min/{1.73_m2} (ref >59)

## 2021-04-13 LAB — BUN: BUN: 22 mg/dL (ref 7–25)

## 2021-04-13 LAB — HEPATIC FUNCTION PANEL
ALBUMIN/GLOBULIN RATIO: 1 (ref 0.8–1.4)
ALBUMIN: 3.3 g/dL — ABNORMAL LOW (ref 3.5–5.7)
ALKALINE PHOSPHATASE: 68 U/L (ref 34–104)
ALT (SGPT): 21 U/L (ref 7–52)
AST (SGOT): 33 U/L (ref 13–39)
BILIRUBIN DIRECT: 0.06 md/dL (ref <=0.20)
BILIRUBIN TOTAL: 0.3 mg/dL (ref 0.3–1.2)
BILIRUBIN, INDIRECT: 0.24 mg/dL (ref <=1)
GLOBULIN: 3.4 (ref 2.9–5.4)
PROTEIN TOTAL: 6.7 g/dL (ref 6.4–8.9)

## 2021-04-13 LAB — CBC WITH DIFF
BASOPHIL #: 0.1 10*3/uL (ref 0.00–2.50)
BASOPHIL %: 1 % (ref 0–3)
EOSINOPHIL #: 0.6 10*3/uL (ref 0.00–2.40)
EOSINOPHIL %: 7 % (ref 0–7)
HCT: 34.8 % — ABNORMAL LOW (ref 42.0–51.0)
HGB: 11.6 g/dL — ABNORMAL LOW (ref 13.5–18.0)
LYMPHOCYTE #: 1.8 10*3/uL — ABNORMAL LOW (ref 2.10–11.00)
LYMPHOCYTE %: 22 % — ABNORMAL LOW (ref 25–45)
MCH: 27.5 pg (ref 27.0–32.0)
MCHC: 33.3 g/dL (ref 32.0–36.0)
MCV: 82.7 fL (ref 78.0–99.0)
MONOCYTE #: 0.9 10*3/uL (ref 0.00–4.10)
MONOCYTE %: 11 % (ref 0–12)
MPV: 8.5 fL (ref 7.4–10.4)
NEUTROPHIL #: 4.8 10*3/uL (ref 4.10–29.00)
NEUTROPHIL %: 59 % (ref 40–76)
PLATELETS: 396 10*3/uL (ref 140–440)
RBC: 4.2 10*6/uL (ref 4.20–6.00)
RDW: 19.3 % — ABNORMAL HIGH (ref 11.6–14.8)
WBC: 8.1 10*3/uL (ref 4.0–10.5)
WBCS UNCORRECTED: 8.1 10*3/uL

## 2021-04-13 LAB — C-REACTIVE PROTEIN (CRP): C-REACTIVE PROTEIN (CRP): 1.2 mg/dL — ABNORMAL HIGH (ref 0.1–0.5)

## 2021-04-15 ENCOUNTER — Other Ambulatory Visit: Payer: Self-pay

## 2021-04-15 ENCOUNTER — Other Ambulatory Visit: Payer: 59

## 2021-04-16 ENCOUNTER — Encounter (INDEPENDENT_AMBULATORY_CARE_PROVIDER_SITE_OTHER): Payer: Self-pay | Admitting: Student in an Organized Health Care Education/Training Program

## 2021-04-16 NOTE — Progress Notes (Signed)
[name]  04/16/2021    OPAT Lab Monitoring Note    I have reviewed the patient's labs from 4/10 available in EMR pertaining to antimicrobial monitoring.   They are stable when compared to previous values.  We will continue with the current course of treatment.       Johny Chess MD  Section of Infectious Diseases  04/16/2021

## 2021-04-21 LAB — FUNGUS CULTURE: FUNGAL CULTURE: NO GROWTH

## 2021-04-22 LAB — FUNGUS CULTURE: FUNGAL CULTURE: NO GROWTH

## 2021-04-23 ENCOUNTER — Encounter (INDEPENDENT_AMBULATORY_CARE_PROVIDER_SITE_OTHER): Payer: Self-pay | Admitting: Student in an Organized Health Care Education/Training Program

## 2021-04-23 NOTE — Progress Notes (Signed)
Alexander Richard  04/23/2021      Outpatient Parenteral Antimicrobial Therapy Program           Telephone Call    Called pt to f/u about antibiotics.  Per patient, when he was first d/c from the hospital he was off IV and on PO Augmentin prescribed at discharge on 3/30 until Wednesday, 4/3. He then was on Zosyn until 4/10 when his PICC line was accidentally pulled. After that he started taking the PO Augmentin again prescribed to him on discharge from [facility]. OPAT was not made aware of this and found incidentally when pt was called to inquire about weekly labs.      He does note that he is doing ok.  He has been having swelling of his bilateral LE, however, for 1.5 weeks. He reached out to his PCP at the [place]. A covering provider for Dr. Oletta Richard ordered some tests that he is to have done today/tomorrow.  He has never been told he had CHF before but notes this was discussed as a possible culprit and what they were trying to rule out.  He notes that stopping the zosyn has not seemed to have helped w/ the swelling either after discussing high salt content.     Pt notes he is not coughing/producing sputum but says he has not done this since he was  admitted.   He is still having dyspnea and notes that it can occur when he is just sitting watching TV w/ associated tachycardia.  He will have 2-3 days where he feels pretty good but then 1-2 days where he is feeling poorly especially w/ fatigue. No fevers/chills.  Dyspnea not worsened w/ lying flat.      He is hoping to come to his pulmonology apt on 4/27- this will be based on if he gets paid and has the means to travel.  He notes that someone reached out to him about scheduling a CT scan at [facility] but this has not been finalized. He does have a CT scan scheduled at the [place] in [place] on 4/28.  Discussed that he probably doesn't need both tests but a CT scan done here for his f/u w/ Pulm would likely be very helpful.      At this time, the pt really needs imaging/follow-up.  Will see if pt can be seen by ID on the same day as pulmonology apt.  Will see if CT scan could be done that day as well.  Will see if any resources available for pt in terms of transportation cost.  Pt's insurance is 100% through the [place].     ASSESSMENT: Pt w/ hx of R middle/lower lobe abscess s/p EBUS on 3/24 w/ gross pus noted w/ BAL cultures, including AFB, fungal all -ve likely due to prior abx exposure on empiric coverage w/ Zosyn now Augmentin w/ need for f/u imaging.     PLAN:   1. Continue Augmentin, pt thinks he has supply until f/u.   2. Will see if coordinated apt w/ ID can be made as well as CT scan   3. Reach out to CM about transportation cost issues.       Alexander Pu, MD, 04/23/2021  Section of Infectious Diseases  Outpatient Parenteral Antimicrobial Therapy (OPAT)

## 2021-04-23 NOTE — Progress Notes (Signed)
Called pt wasn't sure who his hh was he said [facility] out of [place].  He said his line fell out on April 10.   So his PICC line is out.  We was not notified.  Velda Shell  LPN  OPAT Nurse   [phone]

## 2021-04-24 ENCOUNTER — Encounter (INDEPENDENT_AMBULATORY_CARE_PROVIDER_SITE_OTHER): Payer: Self-pay | Admitting: Student in an Organized Health Care Education/Training Program

## 2021-04-24 ENCOUNTER — Other Ambulatory Visit (INDEPENDENT_AMBULATORY_CARE_PROVIDER_SITE_OTHER): Payer: Self-pay | Admitting: Student in an Organized Health Care Education/Training Program

## 2021-04-24 ENCOUNTER — Encounter (INDEPENDENT_AMBULATORY_CARE_PROVIDER_SITE_OTHER): Payer: Self-pay

## 2021-04-29 NOTE — Progress Notes (Signed)
Andrena Mews      Outpatient Parenteral Antimicrobial Therapy Program        ID nurse reached out to scheduler regarding CT scan. Unfortunately, there are no open slots in [place] for CT scan the day pt is coming for pulmonology visit on 4/27.  Scheduler working on getting scan done in [place].  Pt scheduled w/ ID on the same day, scheduled for 2:00 pm. Spoke w/ social work of clinic. No travel vouchers available.     Clovis Pu, MD, 04/24/2021  Section of Infectious Diseases  Outpatient Parenteral Antimicrobial Therapy (OPAT)

## 2021-04-30 ENCOUNTER — Encounter (INDEPENDENT_AMBULATORY_CARE_PROVIDER_SITE_OTHER): Payer: Self-pay

## 2021-04-30 ENCOUNTER — Ambulatory Visit (INDEPENDENT_AMBULATORY_CARE_PROVIDER_SITE_OTHER): Payer: Self-pay | Admitting: INTERNAL MEDICINE

## 2021-04-30 ENCOUNTER — Ambulatory Visit (INDEPENDENT_AMBULATORY_CARE_PROVIDER_SITE_OTHER): Payer: Self-pay | Admitting: PULMONARY DISEASE

## 2021-05-09 LAB — AFB CULTURE WITH STAIN
AFB CULTURE: NO GROWTH
AFB CULTURE: NO GROWTH
AFB CULTURE: NO GROWTH
AFB SMEAR: NEGATIVE
AFB SMEAR: NEGATIVE
AFB SMEAR: NEGATIVE
AFB SMEAR: NEGATIVE

## 2021-05-10 LAB — AFB CULTURE WITH STAIN: AFB CULTURE: NO GROWTH

## 2022-06-18 IMAGING — MR MRI THORACIC SPINE WITHOUT CONTRAST
4 of 5 series · 19 of 48 positions shown · IV contrast (gadolinium)
Comparison: None available.

﻿EXAM:  [id number]   MRI THORACIC SPINE WITHOUT CONTRAST
INDICATION: Mid back pain. Low back pain.
TECHNIQUE: Multiplanar, multisequential MRI of the thoracic spine was performed without gadolinium contrast.  The body coil was used due to the body habitus and curvature of the spine. Overall quality is acceptable.

[Series 5: T2 · sagittal · 5.0mm · 0.78mm/px · 8 of 11 slices shown (1 of 2)]
[im 1/11]
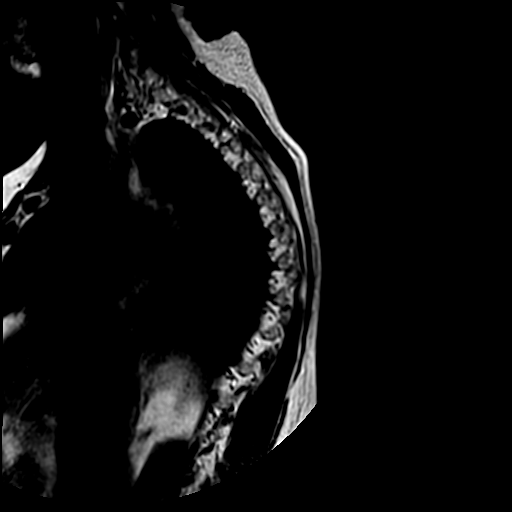
[im 2/11]
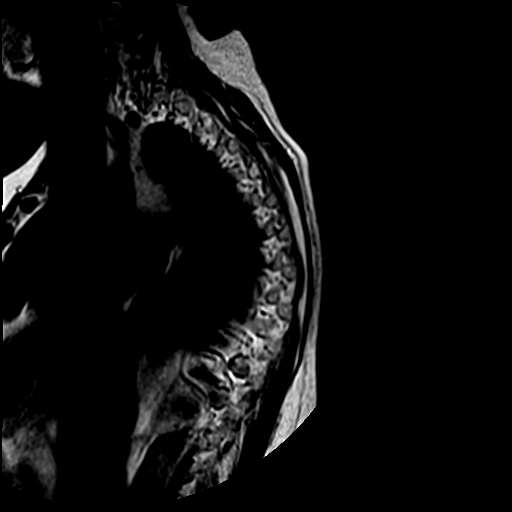
[im 3/11]
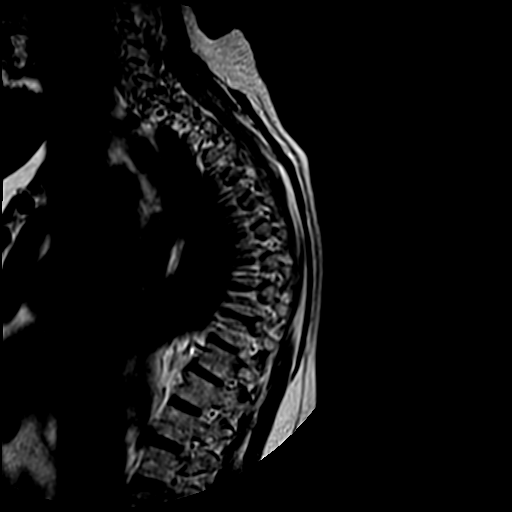
[im 5/11]
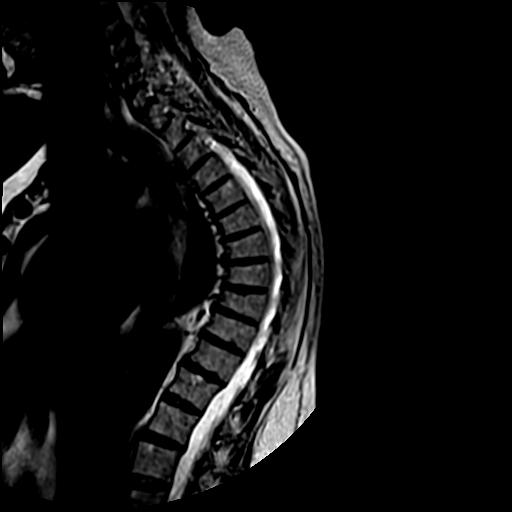
[im 6/11]
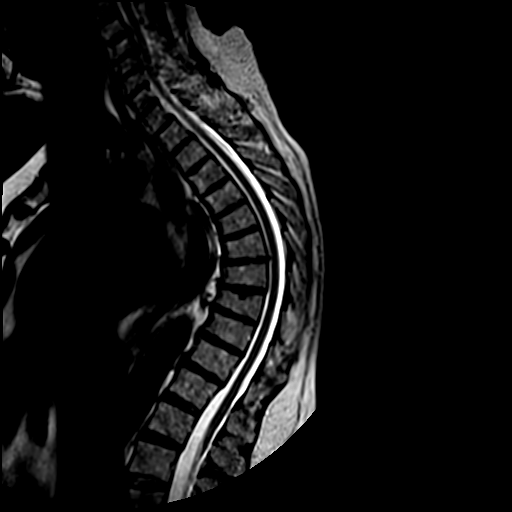
[im 8/11]
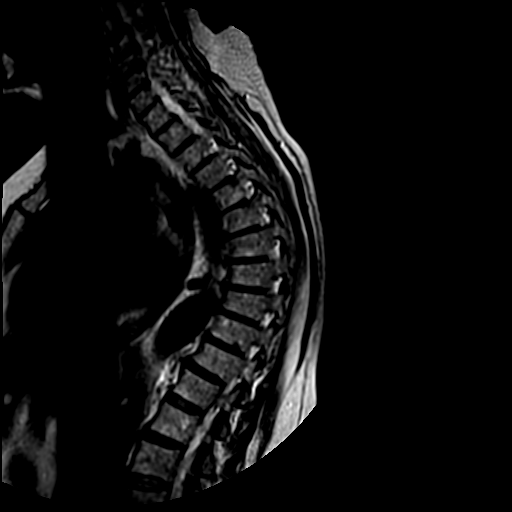
[im 9/11]
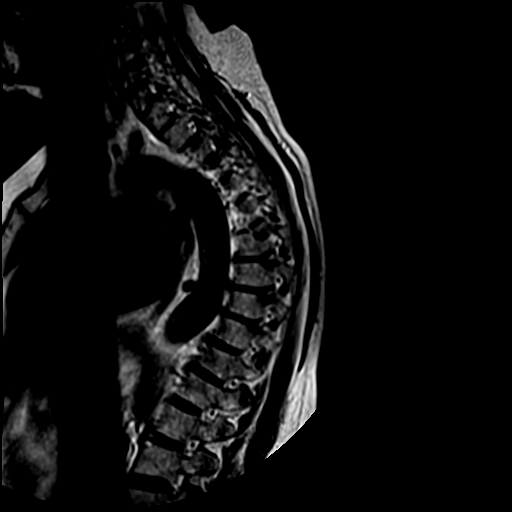
[im 11/11]
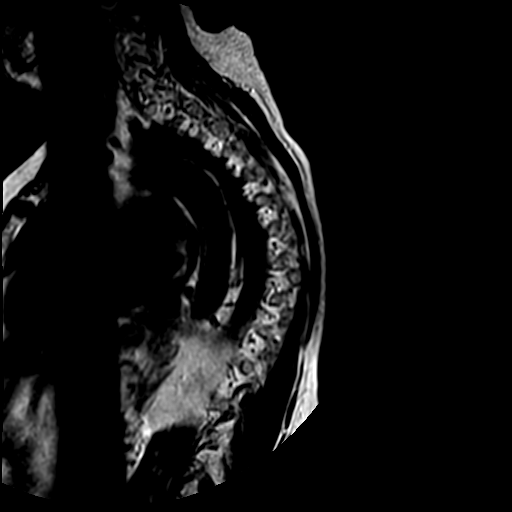

[Series 6: T1 · sagittal · 5.0mm · 0.78mm/px · 3 of 11 slices shown (1 of 2)]
[im 2/11]
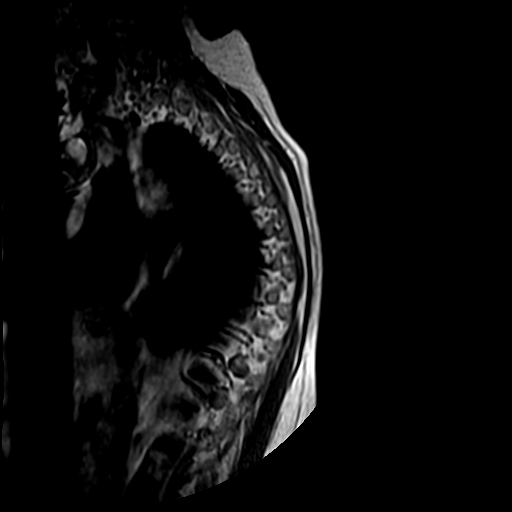
[im 6/11]
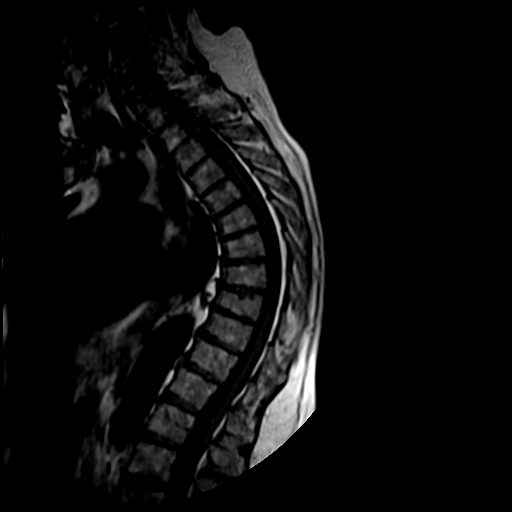
[im 9/11]
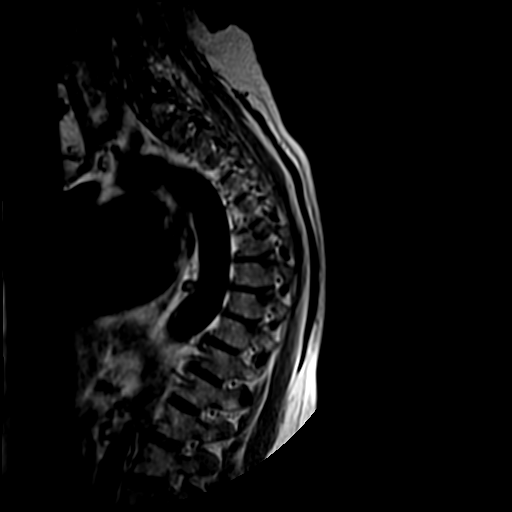

[Series 8: T2 · axial · 5.0mm · 0.45mm/px · z∈[-176,-129]mm · 5 of 13 slices shown (2 of 2)]
[im 1/13]
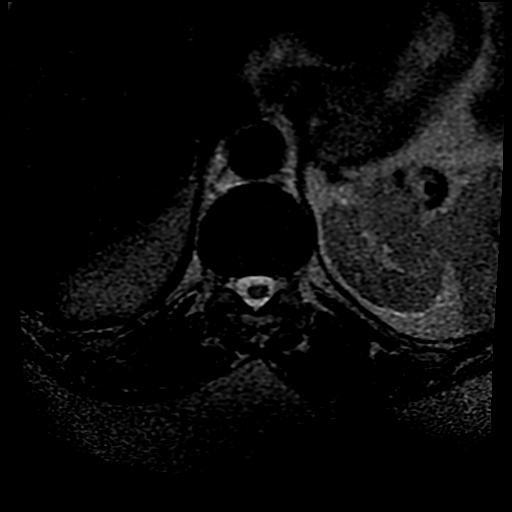
[im 2/13]
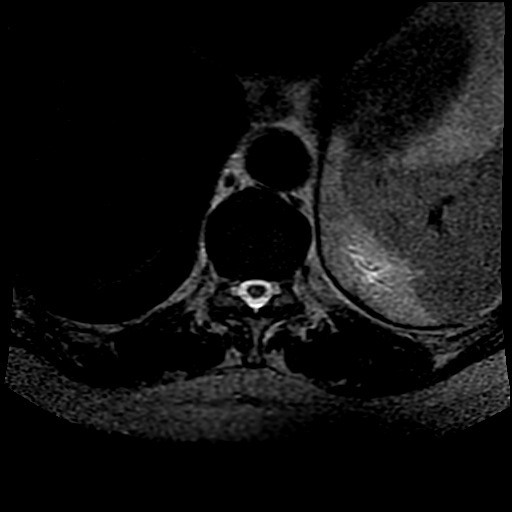
[im 3/13]
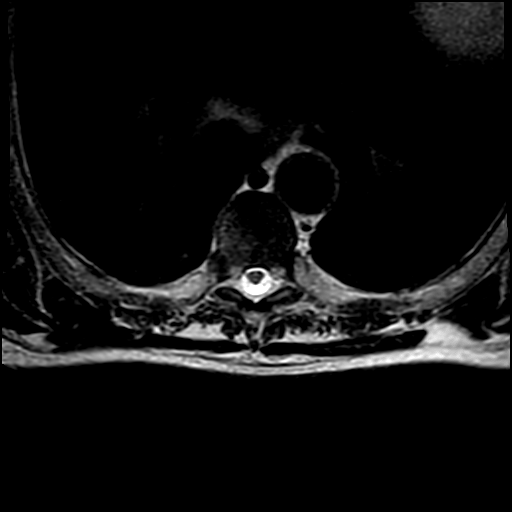
[im 7/13]
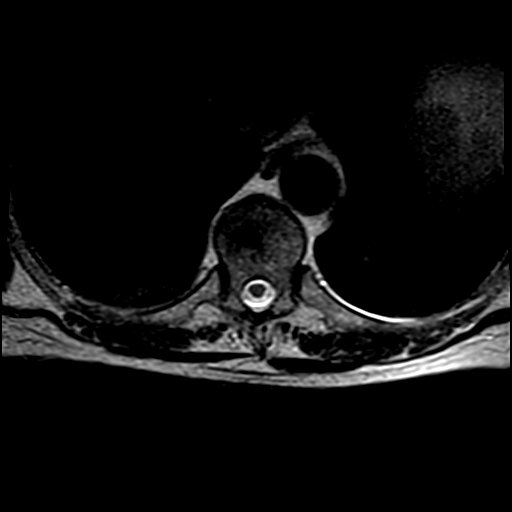
[im 11/13]
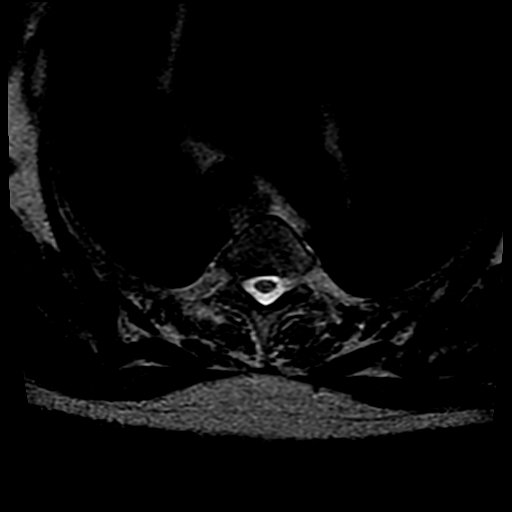

[Series 9: T1 · axial · 5.0mm · 0.45mm/px · z∈[-159,-129]mm · 3 of 13 slices shown (2 of 2)]
[im 2/13]
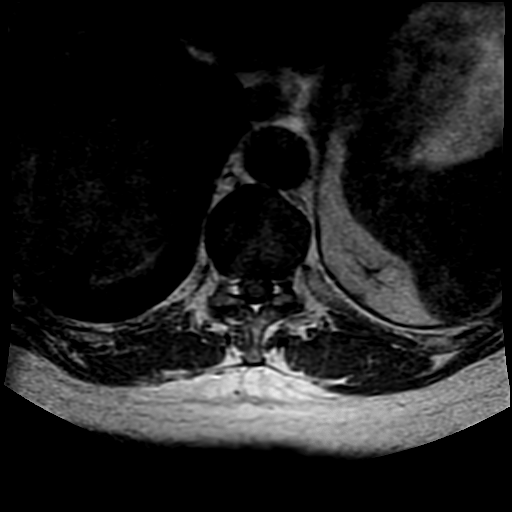
[im 7/13]
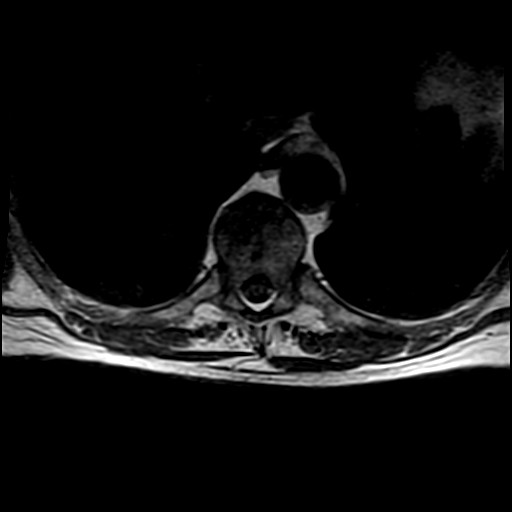
[im 11/13]
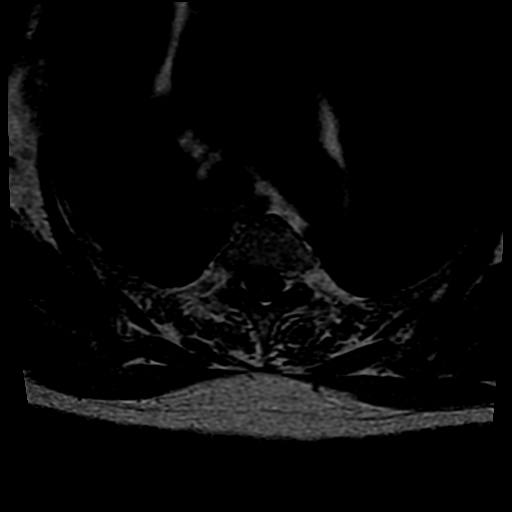

[19 of 48 positions shown; findings below may reference images not displayed]

FINDINGS: No acute or focal bone changes of thoracic vertebrae are seen.  Minimal old anterior wedging of T8 and T9 vertebral bodies due to old fracture is noted.

No evidence of thoracic disc herniation or central spinal stenosis is noted.  No foraminal stenosis is noted in the thoracic region.

Thoracic spinal cord shows no focal lesions.  Paravertebral soft tissues are unremarkable.
IMPRESSION: 1. No acute bony lesions of thoracic vertebrae. Mild old anterior compression fractures of T8 and T9 vertebrae are noted with exaggerated thoracic kyphosis.

2. No evidence of thoracic disc herniation or spinal stenosis is noted.  No foraminal stenosis is noted in the thoracic region.

3. No focal lesions of thoracic spinal cord are seen.

## 2022-06-18 IMAGING — MR MRI LUMBAR SPINE WITHOUT CONTRAST
4 of 6 series · 31 of 48 positions shown · IV contrast (gadolinium)
Comparison: None available.

﻿EXAM:  [id number]   MRI LUMBAR SPINE WITHOUT CONTRAST
INDICATION: Low back pain.  No history of back surgery.
TECHNIQUE: Multiplanar, multisequential MRI of the lumbosacral spine was performed without gadolinium contrast.

[Series 5: T2 · sagittal · 4.0mm · 0.94mm/px · 6 of 13 slices shown (1 of 3)]
[im 1/13]
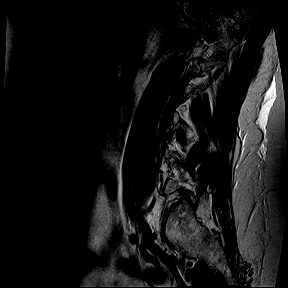
[im 3/13]
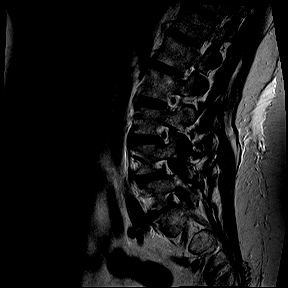
[im 5/13]
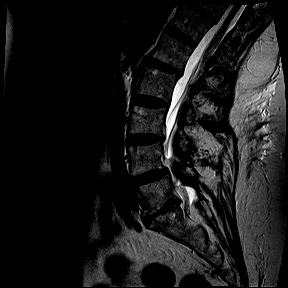
[im 8/13]
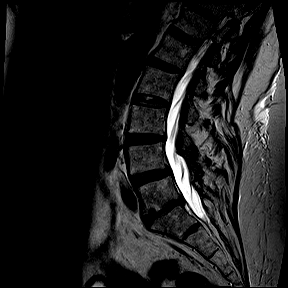
[im 10/13]
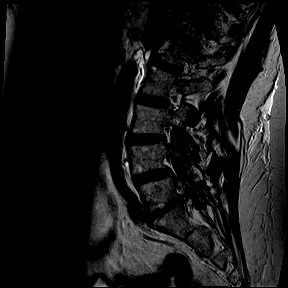
[im 13/13]
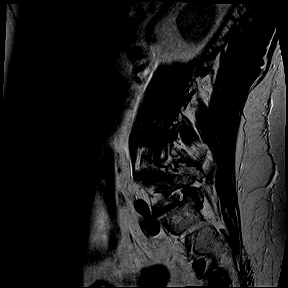

[Series 6: T1 · sagittal · 4.0mm · 0.94mm/px · 6 of 13 slices shown]
[im 1/13]
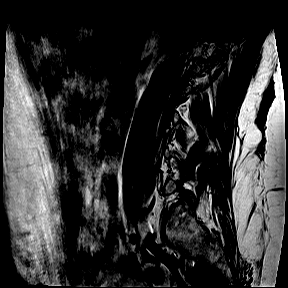
[im 3/13]
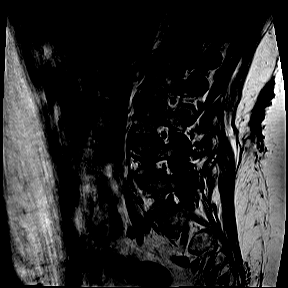
[im 5/13]
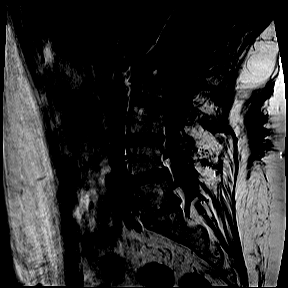
[im 8/13]
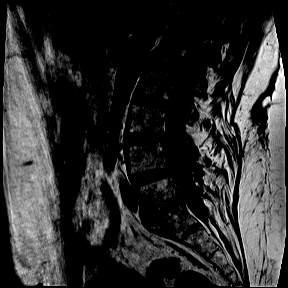
[im 10/13]
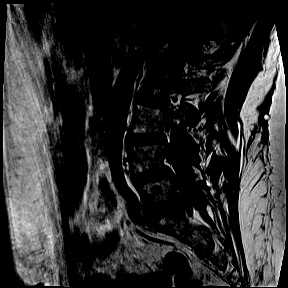
[im 13/13]
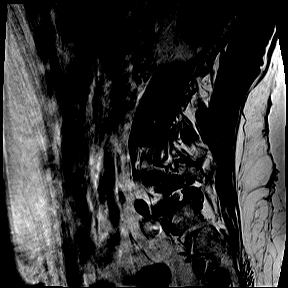

[Series 8: T2 · coronal · 5.0mm · 0.82mm/px · 8 of 18 slices shown (2 of 3)]
[im 1/18]
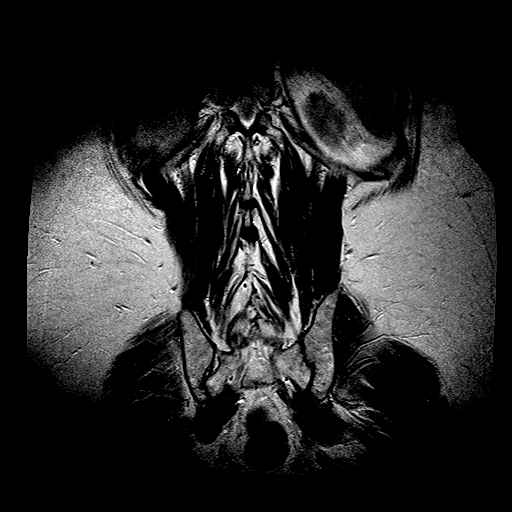
[im 3/18]
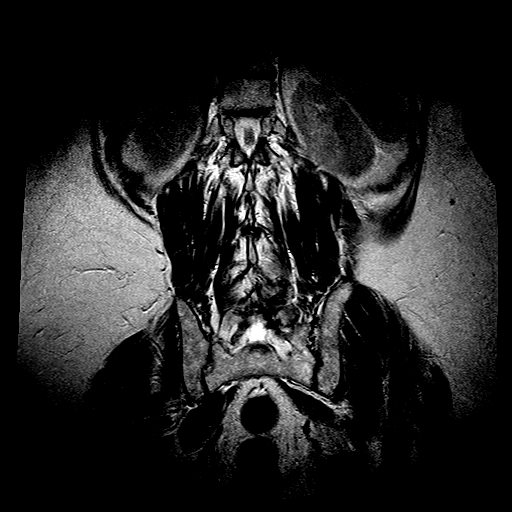
[im 5/18]
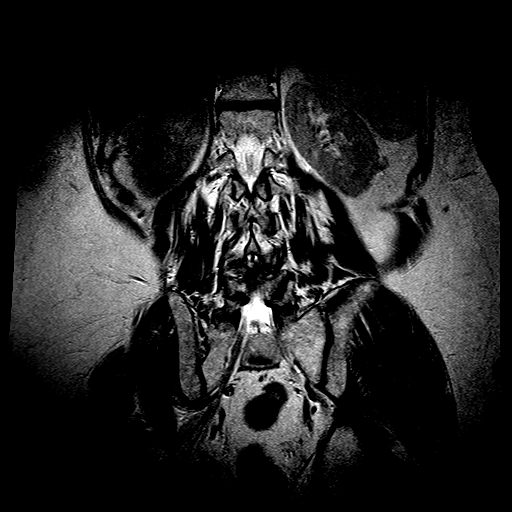
[im 8/18]
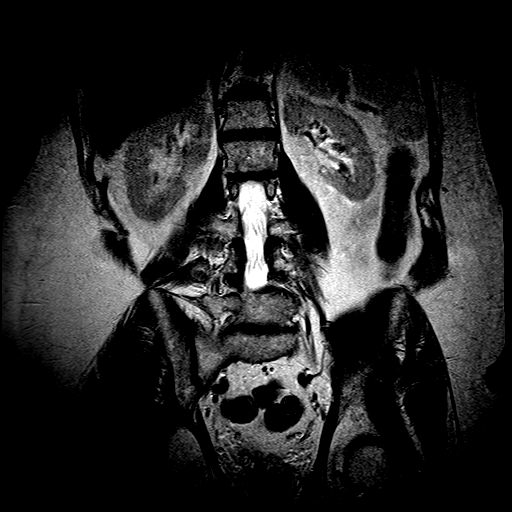
[im 10/18]
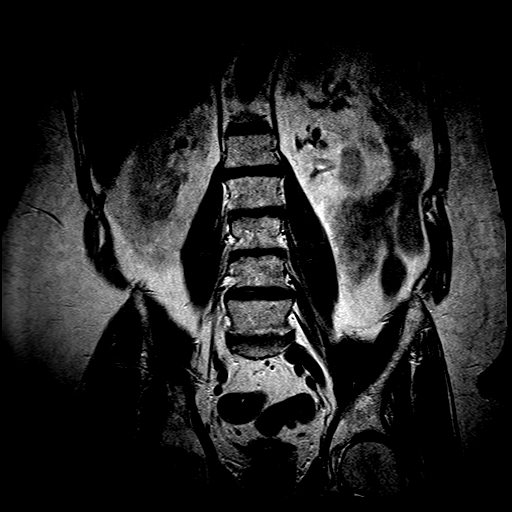
[im 13/18]
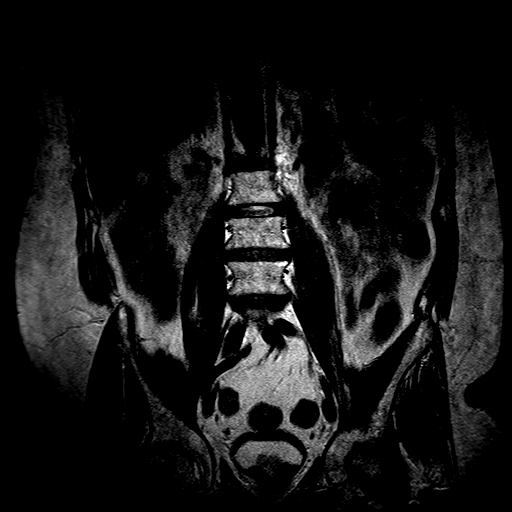
[im 15/18]
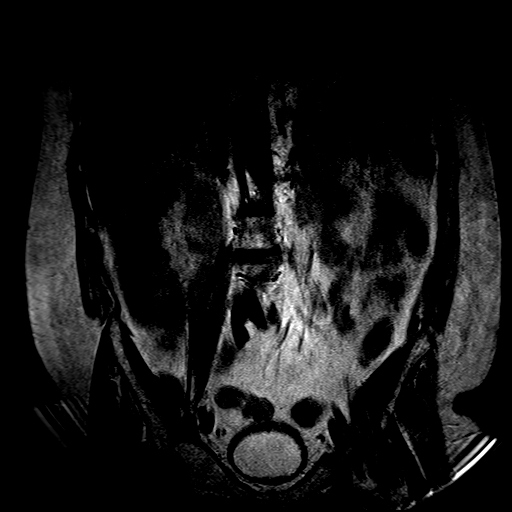
[im 18/18]
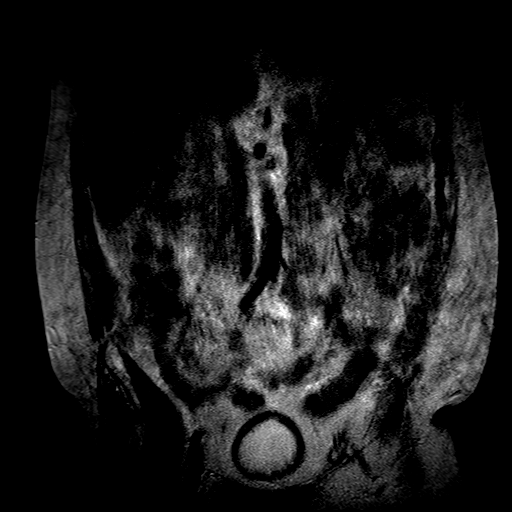

[Series 9: T2 · axial · 4.0mm · 0.52mm/px · z∈[-89,+121]mm · 11 of 23 slices shown (3 of 3)]
[im 1/23]
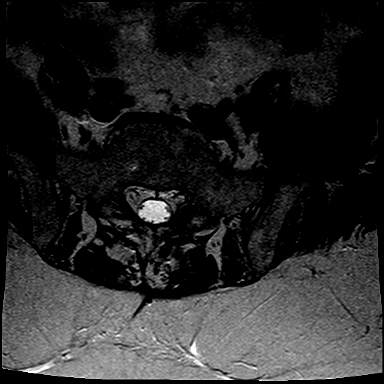
[im 3/23]
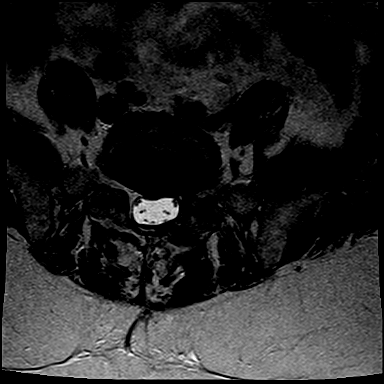
[im 5/23]
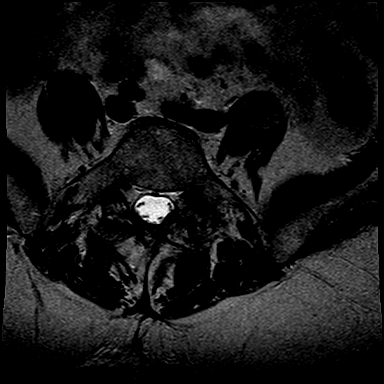
[im 7/23]
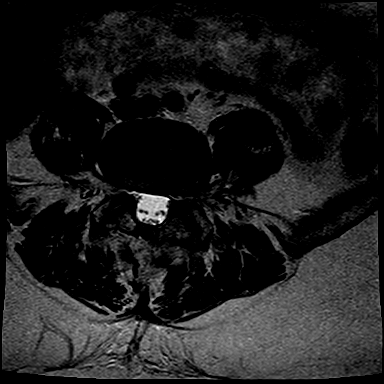
[im 9/23]
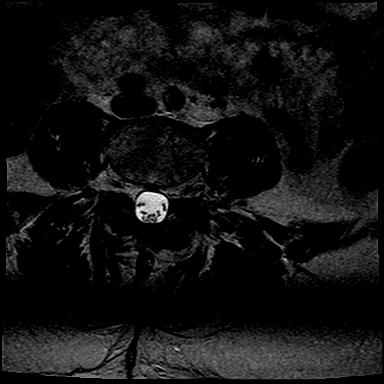
[im 12/23]
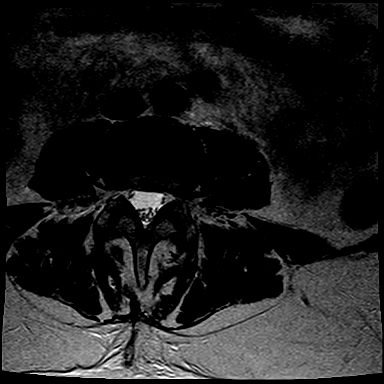
[im 14/23]
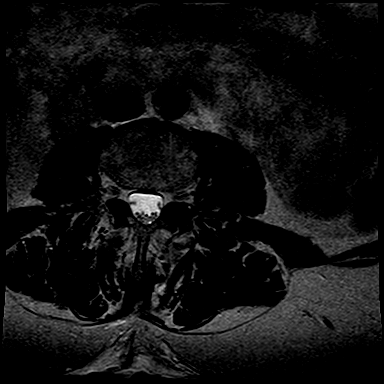
[im 16/23]
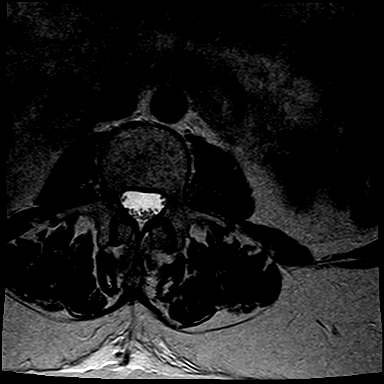
[im 18/23]
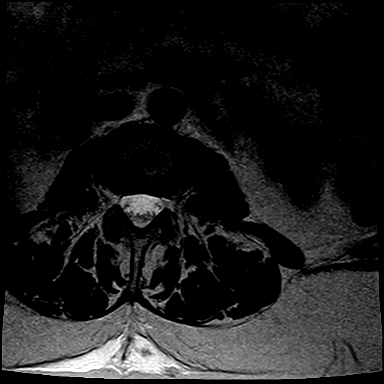
[im 20/23]
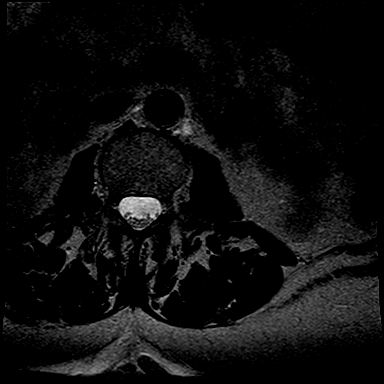
[im 23/23]
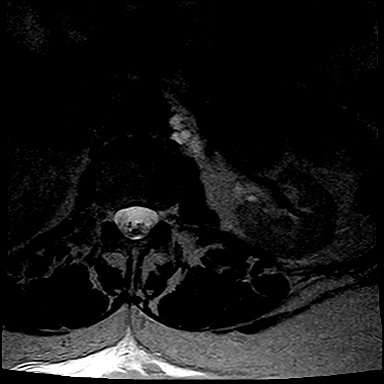

[31 of 48 positions shown; findings below may reference images not displayed]

FINDINGS: No acute bony lesions of lumbar vertebrae are seen.  Conus medullaris and cauda equina structures are normal in the sagittal projection. 

At L1-2 level, no focal disc lesions are seen. 

At L2-3 level, no focal disc lesions are seen. 

At L3-4 level, mild degenerative disc disease and facet arthropathy is causing mild compromise of both lateral recesses.

At L4-5 level, possible postsurgical change is noted with dorsal laminectomy despite the history provided by the patient.  Degenerative disc changes and facet arthropathy are causing moderate biforaminal narrowing. Minimal anterolisthesis of L4 on L5 is noted.

At L5-S1 level, possible postsurgical changes on the dorsal aspect noted.  Degenerative disc disease with focal right paracentral disc protrusion mildly impinging on the thecal sac to the right of the midline.  Bulging annulus is causing significant biforaminal narrowing at this level.

Paravertebral soft tissues are unremarkable.
IMPRESSION: 1. No acute bone changes.  Possible postsurgical changes at L4-5, L5-S1 levels.

2. At L4-5 level, possible postsurgical change is noted with dorsal laminectomy despite the history provided by the patient.  Degenerative disc changes and facet arthropathy are causing moderate biforaminal narrowing. Minimal anterolisthesis of L4 on L5 is noted.

3. At L5-S1 level, possible postsurgical changes on the dorsal aspect noted.  Degenerative disc disease with focal right paracentral disc protrusion mildly impinging on the thecal sac to the right of the midline.  Bulging annulus is causing significant biforaminal narrowing at this level.

4. Findings at other disc levels are described above in detail.

## 2022-07-19 IMAGING — MR MRI CERVICAL SPINE WITHOUT CONTRAST
4 of 5 series · 24 of 48 positions shown · non-contrast
Comparison: None available.

﻿EXAM:  [id number]   MRI CERVICAL SPINE WITHOUT CONTRAST
INDICATION: Neck pain, bilateral shoulder pain, bilateral hand numbness, radiculopathy.
TECHNIQUE: Noncontrast multiplanar, multisequence MRI was performed.

[Series 5: T2 · sagittal · 3.0mm · 0.75mm/px · 8 of 13 slices shown (1 of 2)]
[im 1/13]
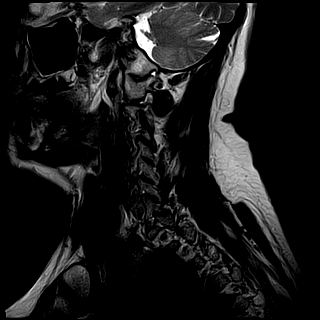
[im 2/13]
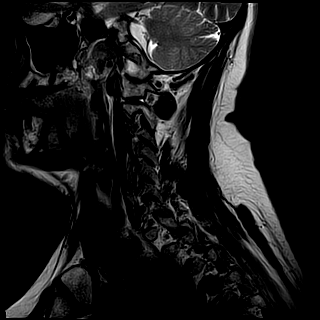
[im 4/13]
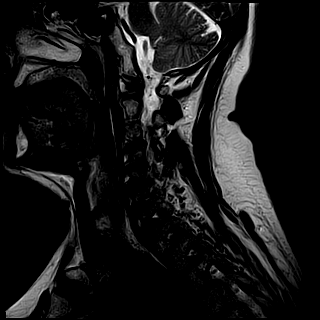
[im 6/13]
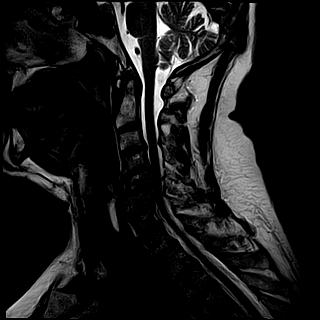
[im 7/13]
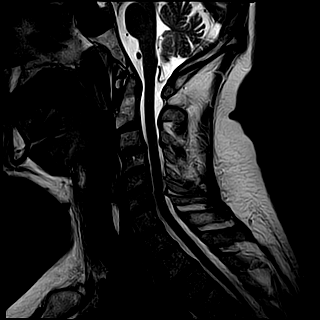
[im 9/13]
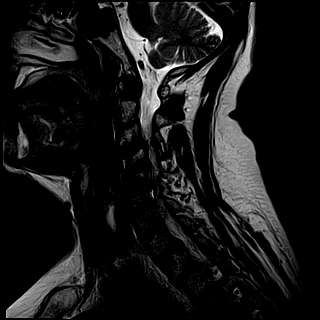
[im 11/13]
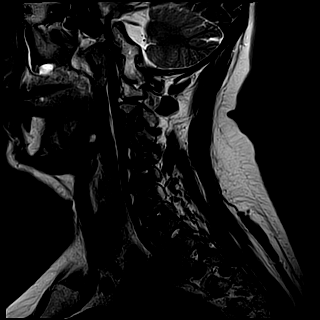
[im 13/13]
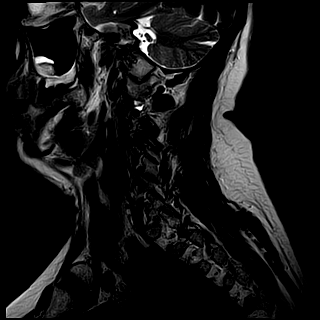

[Series 6: T1 · sagittal · 3.0mm · 0.47mm/px · 4 of 13 slices shown]
[im 1/13]
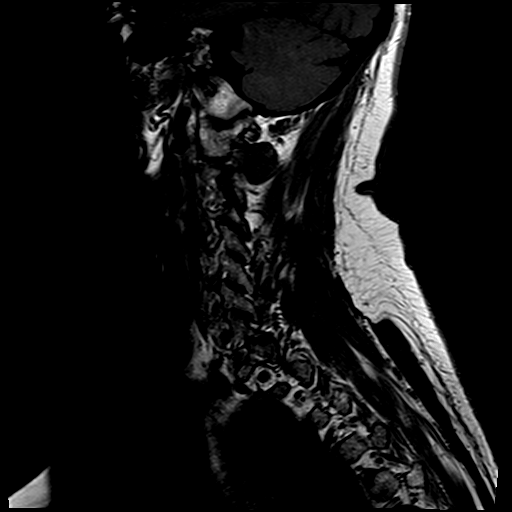
[im 2/13]
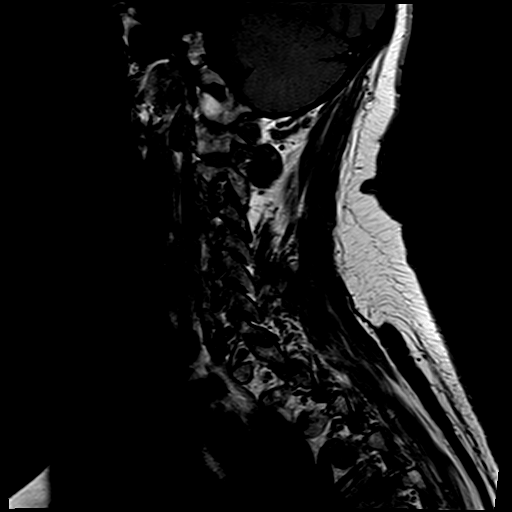
[im 7/13]
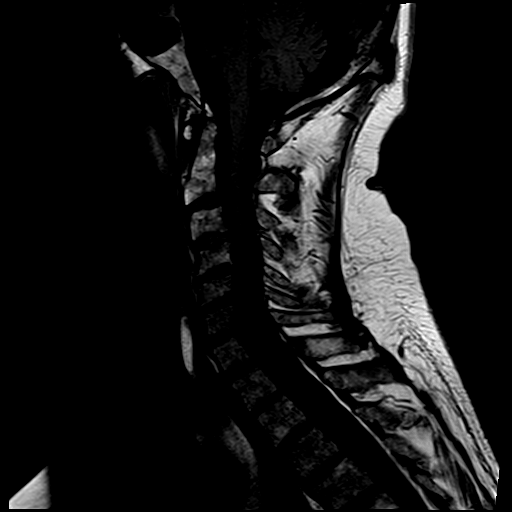
[im 11/13]
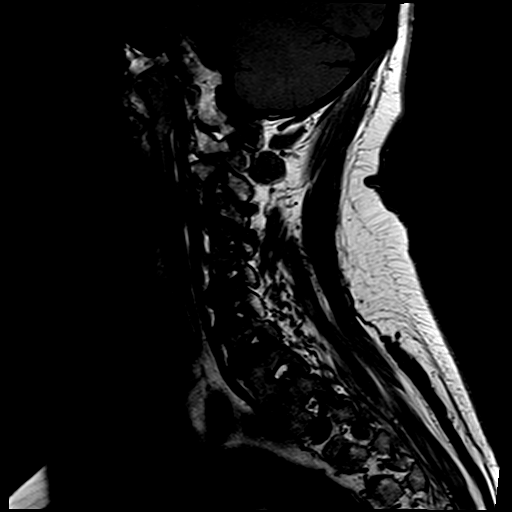

[Series 7: STIR · sagittal · 3.0mm · 0.47mm/px · 3 of 13 slices shown]
[im 2/13]
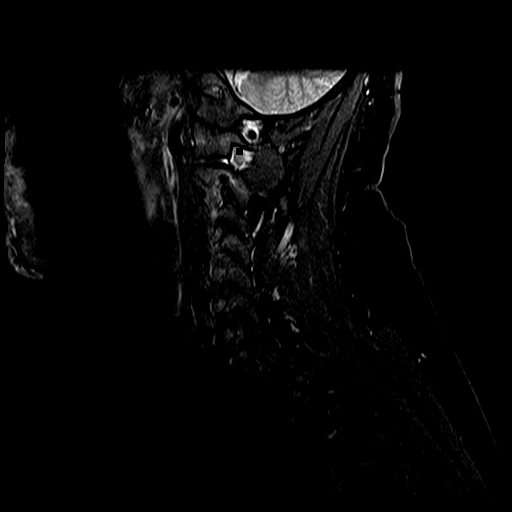
[im 7/13]
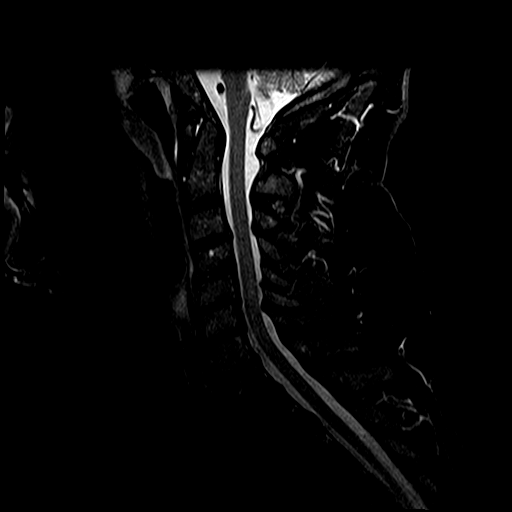
[im 11/13]
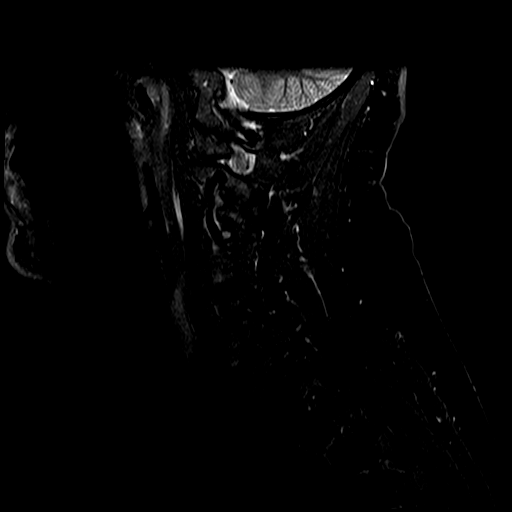

[Series 8: T2 · oblique · 3.0mm · 0.39mm/px · 9 of 21 slices shown (2 of 2)]
[im 1/21]
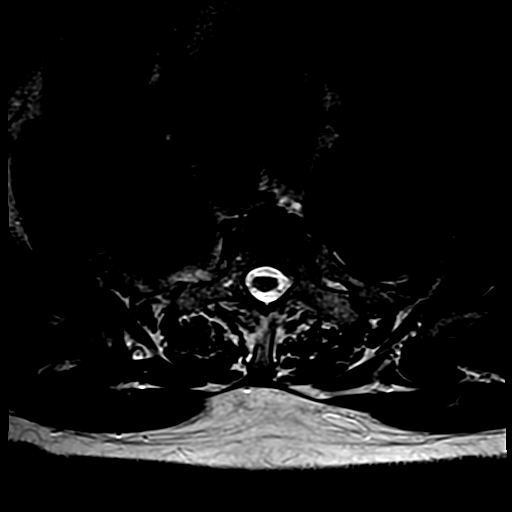
[im 4/21]
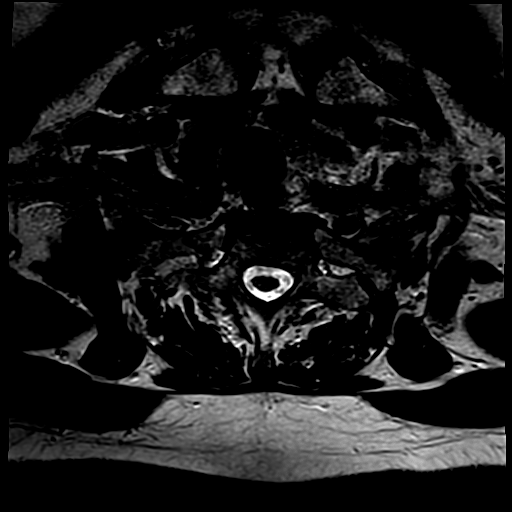
[im 7/21]
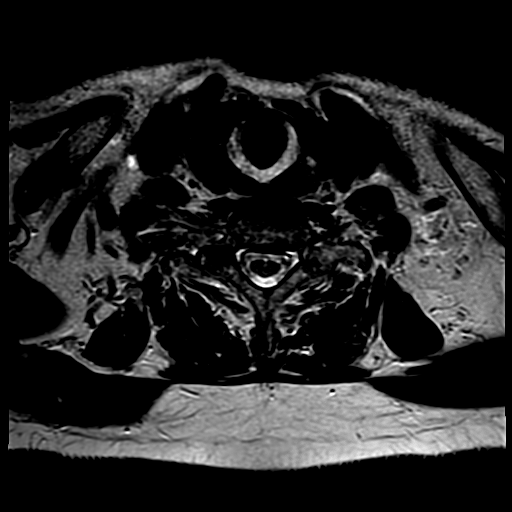
[im 9/21]
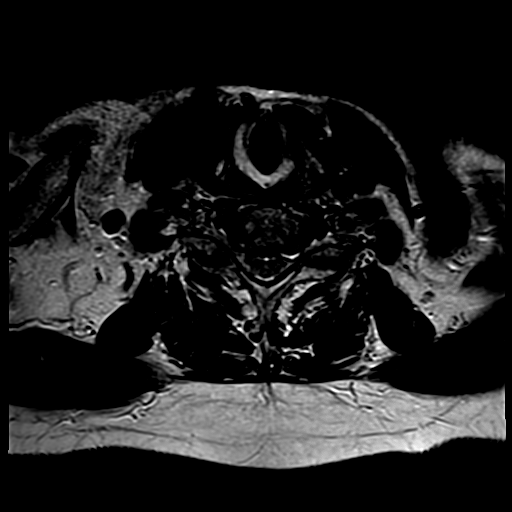
[im 11/21]
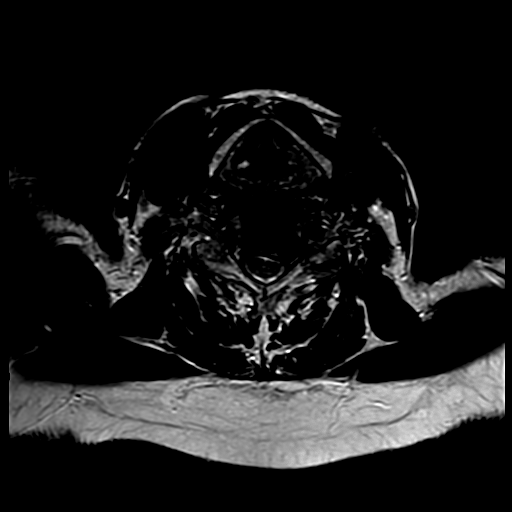
[im 12/21]
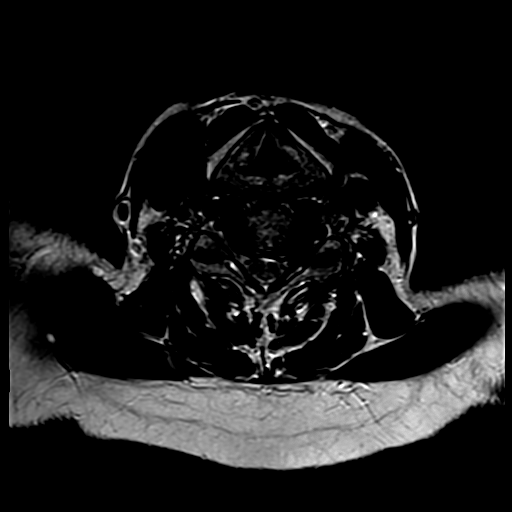
[im 14/21]
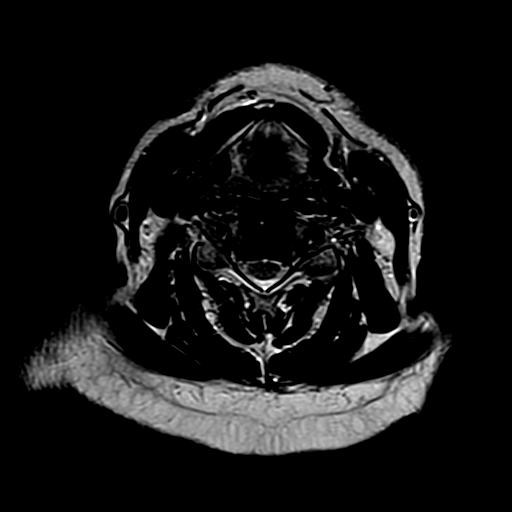
[im 17/21]
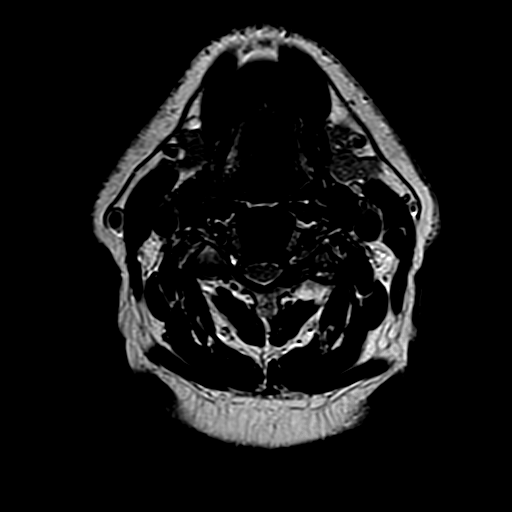
[im 21/21]
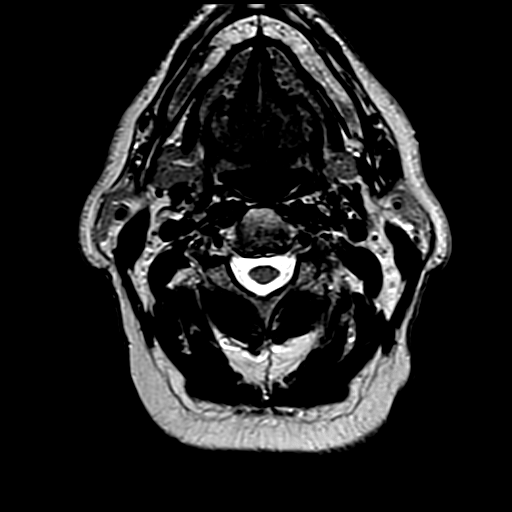

[24 of 48 positions shown; findings below may reference images not displayed]

FINDINGS: There are moderate degenerative changes at multiple levels.  There is reversal of the normal cervical lordosis in the mid cervical spine.  There is no fracture, malalignment, or significant marrow signal alteration.  The spinal cord appears unremarkable.  There is no Chiari malformation.  

C2-C3 is unremarkable.  

At C3-C4, there is mild disc bulging and mild spinal stenosis.  

C4-C5 appears unremarkable. 

At C5-C6, there is mild disc bulging, left greater than right. 

At C6-C7, there is mild disc bulging.  

No disc herniation is seen.
IMPRESSION: 1. Mild disc bulging and mild spinal stenosis as described above.  

2. No disc herniation, fracture, or myelopathy.

## 2023-04-19 ENCOUNTER — Emergency Department: Admission: EM | Admit: 2023-04-19 | Disposition: A | Discharge status: Home / Self Care

## 2023-04-19 ENCOUNTER — Encounter (HOSPITAL_COMMUNITY): Payer: Self-pay

## 2023-04-19 DIAGNOSIS — Z5321 Procedure and treatment not carried out due to patient leaving prior to being seen by health care provider: Secondary | ICD-10-CM | POA: Insufficient documentation

## 2023-04-19 NOTE — Care Management Notes (Signed)
 63 year old male who came in for worsening pain, swelling, redness of Right hand/arm. Patient states his rooster bit him 3-4 days ago around the Right wrist. Last evening he began having worsening pain, redness/swelling of Right hand/arm, with clear blisters and some bruisiing. Patient has pain with extension of fingers, most significantly the index finger.
